# Patient Record
Sex: Male | Born: 1975
Health system: Southern US, Community
[De-identification: ages and names within clinical notes are randomized; demographics above are authoritative.]

## PROBLEM LIST (undated history)

## (undated) DIAGNOSIS — G4733 Obstructive sleep apnea (adult) (pediatric): Secondary | ICD-10-CM

## (undated) DIAGNOSIS — F419 Anxiety disorder, unspecified: Secondary | ICD-10-CM

## (undated) DIAGNOSIS — E782 Mixed hyperlipidemia: Secondary | ICD-10-CM

## (undated) DIAGNOSIS — J45909 Unspecified asthma, uncomplicated: Secondary | ICD-10-CM

## (undated) HISTORY — DX: Obstructive sleep apnea (adult) (pediatric): G47.33

## (undated) HISTORY — DX: Mixed hyperlipidemia: E78.2

## (undated) HISTORY — DX: Unspecified asthma, uncomplicated: J45.909

## (undated) HISTORY — DX: Anxiety disorder, unspecified: F41.9

---
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
## 2007-07-18 HISTORY — PX: CLOSED REDUCTION CLAVICLE FRACTURE: SUR253

## 2015-06-17 ENCOUNTER — Emergency Department (INDEPENDENT_AMBULATORY_CARE_PROVIDER_SITE_OTHER): Payer: 59

## 2015-06-17 ENCOUNTER — Encounter (HOSPITAL_COMMUNITY): Payer: Self-pay | Admitting: *Deleted

## 2015-06-17 ENCOUNTER — Emergency Department (INDEPENDENT_AMBULATORY_CARE_PROVIDER_SITE_OTHER)
Admission: EM | Admit: 2015-06-17 | Discharge: 2015-06-17 | Disposition: A | Discharge status: Home / Self Care | Payer: 59 | Source: Home / Self Care | Attending: Family Medicine | Admitting: Family Medicine

## 2015-06-17 DIAGNOSIS — S6000XA Contusion of unspecified finger without damage to nail, initial encounter: Secondary | ICD-10-CM

## 2015-06-17 MED ORDER — HYDROCODONE-ACETAMINOPHEN 5-325 MG PO TABS: 1.0000 | ORAL_TABLET | Freq: Four times a day (QID) | ORAL | Status: DC | PRN

## 2015-06-17 NOTE — ED Provider Notes (Signed)
CSN: [id number]     Arrival date & time 06/17/15  1901 History   First MD Initiated Contact with Patient 06/17/15 1926     Chief Complaint  Patient presents with  . Finger Injury   (Consider location/radiation/quality/duration/timing/severity/associated sxs/prior Treatment) Patient is a 39 y.o. male presenting with hand pain. The history is provided by the patient.  Hand Pain This is a new problem. The current episode started 3 to 5 hours ago (sofa dropped on finger at home on back of truck.). The problem has not changed since onset.The symptoms are aggravated by bending.    History reviewed. No pertinent past medical history. History reviewed. No pertinent past surgical history. History reviewed. No pertinent family history. Social History  Substance Use Topics  . Smoking status: None  . Smokeless tobacco: None  . Alcohol Use: No    Review of Systems  Constitutional: Negative.   Musculoskeletal: Negative for joint swelling.  Skin: Positive for wound.  All other systems reviewed and are negative.   Allergies  Review of patient's allergies indicates no known allergies.  Home Medications   Prior to Admission medications   Medication Sig Start Date End Date Taking? Authorizing Provider  HYDROcodone-acetaminophen (NORCO/VICODIN) 5-325 MG tablet Take 1 tablet by mouth every 6 (six) hours as needed. 06/17/15   Billy Fischer, MD   Meds Ordered and Administered this Visit  Medications - No data to display  BP 132/76 mmHg  Pulse 78  Temp(Src) 98.6 F (37 C) (Oral)  Resp 18  SpO2 100% No data found.   Physical Exam  Constitutional: He is oriented to person, place, and time. He appears well-developed and well-nourished.  Musculoskeletal: He exhibits tenderness.       Hands: Neurological: He is alert and oriented to person, place, and time.  Skin: Skin is warm and dry.  Nursing note and vitals reviewed.   ED Course  Procedures (including critical care time)  Labs  Review Labs Reviewed - No data to display  Imaging Review Dg Finger Index Left  06/17/2015  CLINICAL DATA:  39 year old male with trauma to the left index finger. EXAM: LEFT INDEX FINGER 2+V COMPARISON:  None. FINDINGS: There is no acute fracture or dislocation. There is diffuse soft tissue swelling of the index finger. No radiopaque foreign object identified. IMPRESSION: No fracture or acute traumatic/ osseous pathology. Electronically Signed   By: Anner Crete M.D.   On: 06/17/2015 19:45   X-rays reviewed and report per radiologist.   Visual Acuity Review  Right Eye Distance:   Left Eye Distance:   Bilateral Distance:    Right Eye Near:   Left Eye Near:    Bilateral Near:         MDM   1. Finger contusion, initial encounter        Billy Fischer, MD 06/17/15 2027

## 2015-06-17 NOTE — ED Notes (Signed)
Pt   Reports    Pt       He   Crushed  His  l    Index       Finger    Between       Two  Metal  Objects  Today         The      Finger       Has   A  Break in  Skin           the  Finger  Is   Soaking      In     hibiclens       Solution

## 2015-06-17 NOTE — Discharge Instructions (Signed)
Ice and pain medicine as needed.return if any problems.

## 2015-09-02 DIAGNOSIS — M5116 Intervertebral disc disorders with radiculopathy, lumbar region: Secondary | ICD-10-CM | POA: Diagnosis not present

## 2015-11-18 ENCOUNTER — Other Ambulatory Visit (HOSPITAL_COMMUNITY): Payer: Self-pay | Admitting: Physical Medicine and Rehabilitation

## 2015-11-18 DIAGNOSIS — M48061 Spinal stenosis, lumbar region without neurogenic claudication: Secondary | ICD-10-CM

## 2015-11-19 ENCOUNTER — Other Ambulatory Visit: Payer: Self-pay | Admitting: Physical Medicine and Rehabilitation

## 2015-11-19 DIAGNOSIS — M48061 Spinal stenosis, lumbar region without neurogenic claudication: Secondary | ICD-10-CM

## 2015-11-25 ENCOUNTER — Ambulatory Visit
Admission: RE | Admit: 2015-11-25 | Discharge: 2015-11-25 | Disposition: A | Discharge status: Home / Self Care | Payer: 59 | Source: Ambulatory Visit | Attending: Physical Medicine and Rehabilitation | Admitting: Physical Medicine and Rehabilitation

## 2015-11-25 DIAGNOSIS — M48061 Spinal stenosis, lumbar region without neurogenic claudication: Secondary | ICD-10-CM

## 2015-11-25 DIAGNOSIS — M5126 Other intervertebral disc displacement, lumbar region: Secondary | ICD-10-CM | POA: Diagnosis not present

## 2016-02-22 MED FILL — VENTOLIN HFA 90 MCG INHALER: 108 (90 BAS | 25 days supply | Qty: 18 | Fill #0

## 2016-04-05 DIAGNOSIS — M1611 Unilateral primary osteoarthritis, right hip: Secondary | ICD-10-CM | POA: Diagnosis not present

## 2016-04-05 MED FILL — CELECOXIB 200 MG CAPSULE: 200 | 30 days supply | Qty: 30 | Fill #0

## 2016-06-15 ENCOUNTER — Other Ambulatory Visit (INDEPENDENT_AMBULATORY_CARE_PROVIDER_SITE_OTHER): Payer: Self-pay | Admitting: Radiology

## 2016-06-15 DIAGNOSIS — M79671 Pain in right foot: Secondary | ICD-10-CM

## 2016-06-15 MED ORDER — TRAMADOL HCL 50 MG PO TABS: 50.0000 mg | ORAL_TABLET | Freq: Two times a day (BID) | ORAL | 0 refills | Status: DC

## 2016-09-28 DIAGNOSIS — G479 Sleep disorder, unspecified: Secondary | ICD-10-CM | POA: Diagnosis not present

## 2016-10-26 ENCOUNTER — Other Ambulatory Visit (INDEPENDENT_AMBULATORY_CARE_PROVIDER_SITE_OTHER): Payer: Self-pay | Admitting: Family

## 2016-10-26 MED ORDER — ALBUTEROL SULFATE HFA 108 (90 BASE) MCG/ACT IN AERS: 2.0000 | INHALATION_SPRAY | Freq: Four times a day (QID) | RESPIRATORY_TRACT | 2 refills | Status: DC | PRN

## 2016-10-26 MED FILL — VENTOLIN HFA 90 MCG INHALER: 108 (90 BAS | 25 days supply | Qty: 18 | Fill #0

## 2016-11-28 DIAGNOSIS — R0681 Apnea, not elsewhere classified: Secondary | ICD-10-CM | POA: Diagnosis not present

## 2016-12-23 DIAGNOSIS — G4733 Obstructive sleep apnea (adult) (pediatric): Secondary | ICD-10-CM | POA: Diagnosis not present

## 2016-12-28 DIAGNOSIS — G4733 Obstructive sleep apnea (adult) (pediatric): Secondary | ICD-10-CM | POA: Diagnosis not present

## 2017-01-02 ENCOUNTER — Ambulatory Visit (INDEPENDENT_AMBULATORY_CARE_PROVIDER_SITE_OTHER): Payer: Self-pay

## 2017-01-02 ENCOUNTER — Ambulatory Visit (INDEPENDENT_AMBULATORY_CARE_PROVIDER_SITE_OTHER): Payer: 59 | Admitting: Specialist

## 2017-01-02 DIAGNOSIS — M25522 Pain in left elbow: Secondary | ICD-10-CM

## 2017-01-02 DIAGNOSIS — M79671 Pain in right foot: Secondary | ICD-10-CM

## 2017-01-02 DIAGNOSIS — M7712 Lateral epicondylitis, left elbow: Secondary | ICD-10-CM | POA: Diagnosis not present

## 2017-01-02 MED ORDER — TRAMADOL HCL 50 MG PO TABS: 50.0000 mg | ORAL_TABLET | Freq: Three times a day (TID) | ORAL | 0 refills | Status: DC | PRN

## 2017-01-02 NOTE — Progress Notes (Signed)
Office Visit Note   Patient: Gary Robertson           Date of Birth: Nov 30, 1975           MRN: [id number] Visit Date: 01/02/2017              Requested by: No referring provider defined for this encounter. PCP: Leighton Ruff, MD   Assessment & Plan: Visit Diagnoses:  1. Pain in left elbow   2. Lateral epicondylitis, left elbow   3. Pain in right foot     Plan: Patient's lateral elbow pain offered conservative treatment with injection. After patient consent tennis elbow Marcaine/Depo-Medrol injection was performed after prepping with Betadine. After sitting for a few minutes patient reported excellent relief with Marcaine in place. He was given stretching exercises. Follow-up as he. He continues to have ongoing symptoms may consider MRI scan.  Follow-Up Instructions: Return if symptoms worsen or fail to improve.   Orders:  Orders Placed This Encounter  Procedures  . Hand/Upper Extremity Injection/Arthrocentesis  . XR Elbow 2 Views Left   Meds ordered this encounter  Medications  . DISCONTD: traMADol (ULTRAM) 50 MG tablet    Sig: Take 1 tablet (50 mg total) by mouth every 8 (eight) hours as needed.    Dispense:  60 tablet    Refill:  0      Procedures: Hand/UE Inj Date/Time: 01/02/2017 4:53 PM Performed by: Lanae Crumbly Authorized by: Lanae Crumbly   Consent Given by:  Patient Indications:  Pain Condition: lateral epicondylitis   Site:  L elbow Prep: patient was prepped and draped in usual sterile fashion   Needle Size:  25 G Approach:  Lateral Ultrasound Guidance: No   Medications:  20 mg methylPREDNISolone acetate 40 MG/ML; 0.33 mL bupivacaine 0.25 %     Clinical Data: No additional findings.   Subjective: Chief Complaint  Patient presents with  . Left Elbow - Pain    HPI Patient in today with complaints of left lateral elbow pain. States that this is been ongoing for at least a couple months. Pain localized to the lateral condyle. Aggravated  with gripping and wrist extension. 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 Review of Systems No Current cardiac pulmonary GI GU issues  Objective: Vital Signs: There were no vitals taken for this visit.  Physical Exam  Constitutional: He is oriented to person, place, and time. No distress.  HENT:  Head: Normocephalic.  Eyes: EOM are normal. Pupils are equal, round, and reactive to light.  Pulmonary/Chest: No respiratory distress.  Musculoskeletal:  Of the elbow good range of motion. No swelling or bruising. He is March markedly tender over the lateral condyle. Minimally tender over the radial tunnel. Lateral elbow pain aggravated with resisted forearm supination, wrist extension and grip testing. Neurovascular intact. Negative Tinel's over the cubital tunnel.  Neurological: He is oriented to person, place, and time.    Ortho Exam  Specialty Comments:  No specialty comments available.  Imaging: No results found.   PMFS History: There are no active problems to display for this patient.  Past Medical History:  Diagnosis Date  . Anxiety   . Mixed hyperlipidemia   . OSA (obstructive sleep apnea)    MILD    No family history on file.  No past surgical history on file. Social History   Occupational History  . Occupation: IT  Tobacco Use  . Smoking status: Former Research scientist (life sciences)  . Smokeless tobacco:  Never Used  Substance and Sexual Activity  . Alcohol use: Yes  . Drug use: No  . Sexual activity: Not on file

## 2017-01-22 ENCOUNTER — Other Ambulatory Visit (INDEPENDENT_AMBULATORY_CARE_PROVIDER_SITE_OTHER): Payer: Self-pay | Admitting: Family

## 2017-01-22 MED ORDER — SCOPOLAMINE 1 MG/3DAYS TD PT72: 1.0000 | MEDICATED_PATCH | TRANSDERMAL | 1 refills | Status: DC

## 2017-01-22 MED FILL — TRANSDERM-SCOP 1.5 MG/72HR: 1 | 30 days supply | Qty: 10 | Fill #0

## 2017-04-17 DIAGNOSIS — I499 Cardiac arrhythmia, unspecified: Secondary | ICD-10-CM | POA: Diagnosis not present

## 2017-04-19 ENCOUNTER — Telehealth: Payer: Self-pay

## 2017-04-19 NOTE — Telephone Encounter (Signed)
Notes sent to scheduling.   

## 2017-04-20 DIAGNOSIS — R002 Palpitations: Secondary | ICD-10-CM | POA: Diagnosis not present

## 2017-04-20 DIAGNOSIS — E782 Mixed hyperlipidemia: Secondary | ICD-10-CM | POA: Diagnosis not present

## 2017-04-23 DIAGNOSIS — B078 Other viral warts: Secondary | ICD-10-CM | POA: Diagnosis not present

## 2017-04-30 ENCOUNTER — Ambulatory Visit (INDEPENDENT_AMBULATORY_CARE_PROVIDER_SITE_OTHER): Payer: 59 | Admitting: Radiology

## 2017-04-30 DIAGNOSIS — Z0000 Encounter for general adult medical examination without abnormal findings: Secondary | ICD-10-CM | POA: Diagnosis not present

## 2017-05-03 LAB — LIPID PANEL WITH LDL/HDL RATIO

## 2017-05-04 LAB — COMPREHENSIVE METABOLIC PANEL
AG Ratio: 1.8 (calc) (ref 1.0–2.5)
ALBUMIN MSPROF: 4.9 g/dL (ref 3.6–5.1)
ALKALINE PHOSPHATASE (APISO): 49 U/L (ref 40–115)
ALT: 36 U/L (ref 9–46)
AST: 34 U/L (ref 10–40)
BILIRUBIN TOTAL: 0.9 mg/dL (ref 0.2–1.2)
BUN: 12 mg/dL (ref 7–25)
CALCIUM: 9.6 mg/dL (ref 8.6–10.3)
CO2: 27 mmol/L (ref 20–32)
Chloride: 102 mmol/L (ref 98–110)
Creat: 1.05 mg/dL (ref 0.60–1.35)
Globulin: 2.8 g/dL (calc) (ref 1.9–3.7)
Glucose, Bld: 82 mg/dL (ref 65–99)
POTASSIUM: 4.5 mmol/L (ref 3.5–5.3)
SODIUM: 139 mmol/L (ref 135–146)
TOTAL PROTEIN: 7.7 g/dL (ref 6.1–8.1)

## 2017-05-04 LAB — HEMOGLOBIN A1C
Hgb A1c MFr Bld: 5.4 % of total Hgb (ref <5.7)
Mean Plasma Glucose: 108 (calc)
eAG (mmol/L): 6 (calc)

## 2017-05-04 LAB — TEST AUTHORIZATION

## 2017-05-04 LAB — LIPID PANEL
Cholesterol: 262 mg/dL — ABNORMAL HIGH (ref <200)
HDL: 59 mg/dL (ref >40)
LDL Cholesterol (Calc): 177 mg/dL (calc) — ABNORMAL HIGH
NON-HDL CHOLESTEROL (CALC): 203 mg/dL — AB (ref <130)
Total CHOL/HDL Ratio: 4.4 (calc) (ref <5.0)
Triglycerides: 124 mg/dL (ref <150)

## 2017-05-10 ENCOUNTER — Encounter: Payer: Self-pay | Admitting: Cardiology

## 2017-05-30 ENCOUNTER — Ambulatory Visit: Payer: 59 | Admitting: Cardiology

## 2017-06-01 ENCOUNTER — Encounter: Payer: Self-pay | Admitting: Cardiology

## 2017-06-13 ENCOUNTER — Encounter: Payer: Self-pay | Admitting: Cardiology

## 2017-06-13 ENCOUNTER — Encounter (INDEPENDENT_AMBULATORY_CARE_PROVIDER_SITE_OTHER): Payer: Self-pay

## 2017-06-13 ENCOUNTER — Ambulatory Visit: Payer: 59 | Admitting: Cardiology

## 2017-06-13 VITALS — BP 126/78 | HR 55 | Ht 73.0 in | Wt 200.4 lb

## 2017-06-13 DIAGNOSIS — R9431 Abnormal electrocardiogram [ECG] [EKG]: Secondary | ICD-10-CM | POA: Diagnosis not present

## 2017-06-13 DIAGNOSIS — E7800 Pure hypercholesterolemia, unspecified: Secondary | ICD-10-CM | POA: Diagnosis not present

## 2017-06-13 DIAGNOSIS — R002 Palpitations: Secondary | ICD-10-CM | POA: Diagnosis not present

## 2017-06-13 NOTE — Patient Instructions (Signed)
Medication Instructions:  The current medical regimen is effective;  continue present plan and medications.  Testing/Procedures: Your physician has requested that you have an echocardiogram. Echocardiography is a painless test that uses sound waves to create images of your heart. It provides your doctor with information about the size and shape of your heart and how well your heart's chambers and valves are working. This procedure takes approximately one hour. There are no restrictions for this procedure.  Follow-Up: Follow up as needed after the above testing.  Thank you for choosing [facility]!!     

## 2017-06-13 NOTE — Progress Notes (Signed)
Cardiology Office Note:    Date:  06/13/2017   ID:  Dorcas Mcmurray, DOB Aug 02, 1975, MRN [id number]  PCP:  Leighton Ruff, MD  Cardiologist:  Candee Furbish, MD   Referring MD: Leighton Ruff, MD     History of Present Illness:    EMAAD NANNA is a 41 y.o. male with a hx of anxiety, hyperlipidemia, OSA here for the evaluation of palpitations at the request of Dr. Drema Dallas.  Has been experiencing occasional increased heart rate measured at 120 bpm feeling/but no chest pain, syncope, orthopnea, PND.  Blood pressure is usually normal.  Pulse is usually around 67.  Increased stress at work, a few glasses of wine or beer at night but not excessive.  Occasional smoking of cigarettes.  Exertional activity outside of work such as yard work has been done successfully without any symptoms.  Back in 2014 he saw Dr. Radford Pax with similar type of symptoms and had a treadmill test and echocardiogram which were normal.  At home BP at home 140/100 intermittently. HR Apple watch at times can be 120 or so after doing activities at home but gradually reduces with rest. 3 times at chiropractor noted increased blood pressure, he was told at one point that he may need medication if it does not lower.. Quit e cig. Rare sharp CP, likely musculoskeletal.   He does run approximately 3 days a week without any difficulty.  Past Medical History:  Diagnosis Date  . Anxiety   . Mixed hyperlipidemia   . OSA (obstructive sleep apnea)    MILD    History reviewed. No pertinent surgical history.  Current Medications: Current Meds  Medication Sig  . Multiple Vitamin (MULTIVITAMIN) tablet Take 1 tablet by mouth daily.  . Omega-3 Fatty Acids (FISH OIL) 1000 MG CAPS Take 1 capsule by mouth daily.     Allergies:   Zoloft [sertraline hcl]   Social History   Socioeconomic History  . Marital status: Married    Spouse name: None  . Number of children: None  . Years of education: None  . Highest education level:  None  Social Needs  . Financial resource strain: None  . Food insecurity - worry: None  . Food insecurity - inability: None  . Transportation needs - medical: None  . Transportation needs - non-medical: None  Occupational History  . Occupation: IT  Tobacco Use  . Smoking status: Former Research scientist (life sciences)  . Smokeless tobacco: Never Used  Substance and Sexual Activity  . Alcohol use: Yes  . Drug use: No  . Sexual activity: None  Other Topics Concern  . None  Social History Narrative  . None     Family History: The patient's M and F on HBP treatment.  No MI  ROS:   Please see the history of present illness.     All other systems reviewed and are negative.  EKGs/Labs/Other Studies Reviewed:    The following studies were reviewed today: Prior office notes, lab work, EKG reviewed  EKG:  EKG is ordered today.  The ekg ordered today demonstrates 06/13/17 sinus bradycardia rate 55 with left ventricular hypertrophy personally viewed  Recent Labs: 04/30/2017: ALT 36; BUN 12; Creat 1.05; Potassium 4.5; Sodium 139  Recent Lipid Panel    Component Value Date/Time   CHOL CANCELED 04/30/2017 1130   CHOL 262 (H) 04/30/2017 1130   TRIG CANCELED 04/30/2017 1130   TRIG 124 04/30/2017 1130   HDL CANCELED 04/30/2017 1130   HDL 59 04/30/2017 1130  CHOLHDL 4.4 04/30/2017 1130   LDL 177  Physical Exam:    VS:  BP 126/78   Pulse (!) 55   Ht 6\' 1"  (1.854 m)   Wt 200 lb 6.4 oz (90.9 kg)   SpO2 98%   BMI 26.44 kg/m     Wt Readings from Last 3 Encounters:  06/13/17 200 lb 6.4 oz (90.9 kg)     GEN:  Well nourished, well developed in no acute distress HEENT: Normal NECK: No JVD; No carotid bruits LYMPHATICS: No lymphadenopathy CARDIAC: RRR, no murmurs, rubs, gallops RESPIRATORY:  Clear to auscultation without rales, wheezing or rhonchi  ABDOMEN: Soft, non-tender, non-distended MUSCULOSKELETAL:  No edema; No deformity  SKIN: Warm and dry NEUROLOGIC:  Alert and oriented x  3 PSYCHIATRIC:  Normal affect   ASSESSMENT:    1. Palpitations   2. Abnormal ECG   3. Pure hypercholesterolemia    PLAN:    In order of problems listed above:  Palpitations -Occasional increased heart rate after doing some activity at home which does resolve the resting for a few minutes.  These tachycardic episodes do not appear to be abrupt onset for.  They do not sound like PSVT.  We will check an echocardiogram to ensure proper structure and function.  Abnormal EKG/hypertrophy -We will check an echocardiogram to measure the left ventricle.  Hyperlipidemia - LDL at last check was 177.  He is working on dietary modifications.  Continued exercise.  Continue to monitor this.  Tobacco use -He has stopped E cigarettes.  Continue to encourage cessation.   Medication Adjustments/Labs and Tests Ordered: Current medicines are reviewed at length with the patient today.  Concerns regarding medicines are outlined above.  Orders Placed This Encounter  Procedures  . ECHOCARDIOGRAM COMPLETE   No orders of the defined types were placed in this encounter.   Signed, Candee Furbish, MD  06/13/2017 9:41 AM    [facility]

## 2017-06-20 ENCOUNTER — Ambulatory Visit (HOSPITAL_COMMUNITY): Payer: 59 | Attending: Cardiology

## 2017-06-20 ENCOUNTER — Other Ambulatory Visit: Payer: Self-pay | Admitting: Cardiovascular Disease

## 2017-06-20 ENCOUNTER — Other Ambulatory Visit: Payer: Self-pay

## 2017-06-20 DIAGNOSIS — R002 Palpitations: Secondary | ICD-10-CM | POA: Diagnosis not present

## 2017-06-20 DIAGNOSIS — E785 Hyperlipidemia, unspecified: Secondary | ICD-10-CM | POA: Diagnosis not present

## 2017-06-20 DIAGNOSIS — G4733 Obstructive sleep apnea (adult) (pediatric): Secondary | ICD-10-CM | POA: Insufficient documentation

## 2017-06-20 DIAGNOSIS — F419 Anxiety disorder, unspecified: Secondary | ICD-10-CM | POA: Insufficient documentation

## 2017-06-21 NOTE — Addendum Note (Signed)
Addended by: Stephannie Peters on: 06/21/2017 09:32 AM   Modules accepted: Orders

## 2017-07-13 MED ORDER — BUPIVACAINE HCL 0.25 % IJ SOLN
0.3300 mL | INTRAMUSCULAR | Status: AC | PRN
  Administered 2017-01-02: .33 mL

## 2017-07-13 MED ORDER — METHYLPREDNISOLONE ACETATE 40 MG/ML IJ SUSP
20.0000 mg | INTRAMUSCULAR | Status: AC | PRN
  Administered 2017-01-02: 20 mg

## 2017-07-20 ENCOUNTER — Telehealth: Payer: 59 | Admitting: Family

## 2017-07-20 DIAGNOSIS — M549 Dorsalgia, unspecified: Principal | ICD-10-CM

## 2017-07-20 DIAGNOSIS — G8929 Other chronic pain: Secondary | ICD-10-CM

## 2017-07-20 NOTE — Progress Notes (Signed)
Thank you for the details you included in the comment boxes. Those details are very helpful in determining the best course of treatment for you and help [place] to provide the best care. Given your history, this requires a physical exam. Also, I am aware that tramadol works well for you, but that is not a medication than can be given with the E-visit program. If seen face-to-face, someone might be able to give you that medication (it requires an ink signature due to [facility] regulations), but you would still need to be examined for proper treatment.  Based on what you shared with me it looks like you have a serious condition that should be evaluated in a face to face office visit.  NOTE: Even if you have entered your credit card information for this eVisit, you will not be charged.   If you are having a true medical emergency please call 911.  If you need an urgent face to face visit, [facility] has four urgent care centers for your convenience.  If you need care fast and have a high deductible or no insurance consider:   [email or web address]  [phone]  [address], [address] [place], [place] [address] 8 am to 8 pm Monday-Friday 10 am to 4 pm Saturday-Sunday   The following sites will take your  insurance:    . [facility] [place] a Provider at this Location  [address] [place], [place] [address] . 10 am to 8 pm Monday-Friday . 12 pm to 8 pm Saturday-Sunday   . [facility] at [place] a Provider at this Location  [address], [place], [place] [address] . 8 am to 8 pm Monday-Friday . 9 am to 6 pm Saturday . 11 am to 6 pm Sunday   . [facility] at [place] Get Driving Directions  [address] Suite [place], [place] [address] . 8 am to 8 pm Monday-Friday . 8 am to 4 pm  Saturday-Sunday   Your e-visit answers were reviewed by a board certified advanced clinical practitioner to complete your personal care plan.  Thank you for using e-Visits.

## 2017-08-10 ENCOUNTER — Other Ambulatory Visit (INDEPENDENT_AMBULATORY_CARE_PROVIDER_SITE_OTHER): Payer: Self-pay | Admitting: Radiology

## 2017-08-10 ENCOUNTER — Ambulatory Visit (INDEPENDENT_AMBULATORY_CARE_PROVIDER_SITE_OTHER): Payer: 59 | Admitting: Orthopedic Surgery

## 2017-08-10 ENCOUNTER — Ambulatory Visit (INDEPENDENT_AMBULATORY_CARE_PROVIDER_SITE_OTHER): Payer: 59

## 2017-08-10 ENCOUNTER — Encounter (INDEPENDENT_AMBULATORY_CARE_PROVIDER_SITE_OTHER): Payer: Self-pay | Admitting: Orthopedic Surgery

## 2017-08-10 DIAGNOSIS — M25561 Pain in right knee: Secondary | ICD-10-CM

## 2017-08-10 MED ORDER — TRAMADOL HCL 50 MG PO TABS: 50.0000 mg | ORAL_TABLET | Freq: Three times a day (TID) | ORAL | 0 refills | Status: DC

## 2017-08-10 NOTE — Progress Notes (Signed)
Office Visit Note   Patient: Gary Robertson           Date of Birth: Oct 25, 1975           MRN: [id number] Visit Date: 08/10/2017 Requested by: Leighton Ruff, MD [place], [place] [address] PCP: Leighton Ruff, MD  Subjective: Chief Complaint  Patient presents with  . Right Knee - Injury    HPI: Gary Robertson is a patient who injured his right knee last night while snowboarding.  He was going about 30 miles an hour when he sustained a twisting injury to the knee.  His foot remained locked into the snowboard.  Reported immediate and severe onset of medial knee pain.  Denies any previous injury to the knee.  He is able to weight-bear but it is difficult.  Easier when his leg is in extension for him to weight-bear.  Knee range of motion is difficult.  Tried ibuprofen without relief.  He has more snowboarding planned for this winter.              ROS: All systems reviewed are negative as they relate to the chief complaint within the history of present illness.  Patient denies  fevers or chills.   Assessment & Plan: Visit Diagnoses:  1. Acute pain of right knee     Plan: Impression is right knee pain with fairly significant medial tenderness over the medial epicondyle at the MCL attachment site.  Ultrasound examination does show some disruption of normal architecture at this level.  Not much in the way of significant effusion in the right knee.  He has a trace effusion in the left knee.  Mentation is difficult today due to guarding.  Plan is MRI scan to evaluate radial proximal attachment as well as the medial meniscus.  It is conceivable that he may have injured his anterior cruciate ligament but we will have to tell that from the MRI scan because his examination cannot be done without enough guarding to render it nondiagnostic.  We will also try pen said and an Ace wrap.  He is not too keen on the idea of a hinged knee brace.  Follow-Up Instructions: Return for after MRI.    Orders:  Orders Placed This Encounter  Procedures  . XR KNEE 3 VIEW RIGHT  . MR Knee Right w/o contrast   No orders of the defined types were placed in this encounter.     Procedures: No procedures performed   Clinical Data: No additional findings.  Objective: Vital Signs: There were no vitals taken for this visit.  Physical Exam:   Constitutional: Patient appears well-developed HEENT:  Head: Normocephalic Eyes:EOM are normal Neck: Normal range of motion Cardiovascular: Normal rate Pulmonary/chest: Effort normal Neurologic: Patient is alert Skin: Skin is warm Psychiatric: Patient has normal mood and affect    Ortho Exam: Orthopedic exam demonstrates antalgic gait to the right.  He has trace effusion in both knees.  Extensor mechanism is intact on the right.  He does have pain with valgus stress.  No pain with varus stress.  There is no real ecchymosis or significant swelling around the knee region particularly on the medial side.  Distal MCL is nontender but proximal MCL is tender.  He also has some posterior medial joint line tenderness.  Not much in the way of definitive rotational instability.  Specialty Comments:  No specialty comments available.  Imaging: Xr Knee 3 View Right  Result Date: 08/10/2017 AP lateral merchant right  knee reviewed.  Slight varus alignment is present.  No definite effusion or fracture is noted.  Patella height normal in relation to the femur.  Slight patellar spurring is present.    PMFS History: There are no active problems to display for this patient.  Past Medical History:  Diagnosis Date  . Anxiety   . Mixed hyperlipidemia   . OSA (obstructive sleep apnea)    MILD    History reviewed. No pertinent family history.  History reviewed. No pertinent surgical history. Social History   Occupational History  . Occupation: IT  Tobacco Use  . Smoking status: Former Research scientist (life sciences)  . Smokeless tobacco: Never Used  Substance and  Sexual Activity  . Alcohol use: Yes  . Drug use: No  . Sexual activity: Not on file

## 2017-08-11 ENCOUNTER — Ambulatory Visit
Admission: RE | Admit: 2017-08-11 | Discharge: 2017-08-11 | Disposition: A | Discharge status: Home / Self Care | Payer: 59 | Source: Ambulatory Visit | Attending: Orthopedic Surgery | Admitting: Orthopedic Surgery

## 2017-08-11 DIAGNOSIS — M25561 Pain in right knee: Secondary | ICD-10-CM

## 2017-08-11 DIAGNOSIS — R600 Localized edema: Secondary | ICD-10-CM | POA: Diagnosis not present

## 2017-10-11 MED FILL — VENTOLIN HFA 90 MCG INHALER: 108 (90 BAS | 25 days supply | Qty: 18 | Fill #1

## 2018-05-21 ENCOUNTER — Ambulatory Visit (INDEPENDENT_AMBULATORY_CARE_PROVIDER_SITE_OTHER): Payer: 59

## 2018-05-21 ENCOUNTER — Encounter (INDEPENDENT_AMBULATORY_CARE_PROVIDER_SITE_OTHER): Payer: Self-pay | Admitting: Family Medicine

## 2018-05-21 ENCOUNTER — Ambulatory Visit (INDEPENDENT_AMBULATORY_CARE_PROVIDER_SITE_OTHER): Payer: 59 | Admitting: Family Medicine

## 2018-05-21 DIAGNOSIS — M25572 Pain in left ankle and joints of left foot: Secondary | ICD-10-CM | POA: Diagnosis not present

## 2018-05-21 NOTE — Progress Notes (Addendum)
I saw and examined the patient with Dr. Okey Dupre and agree with assessment and plan as outlined.  WBAT in ASO brace, theraband when pain permits.  X-Rays in 3-4 weeks if not improving.  Left ankle x-rays: Ankle mortise is intact.  Dorsal avulsion fracture anterior talus.  No other acute abnormality seen.  Plantar fascia traction spur present on the calcaneus.

## 2018-05-21 NOTE — Progress Notes (Signed)
  ELVIN MCCARTIN - 42 y.o. male MRN [id number]  Date of birth: Feb 08, 1976    SUBJECTIVE:      Chief Complaint:/ HPI:  42 year old male with acute left ankle pain for the past 2 days.  Patient states he was skateboarding and had an inversion injury to his ankle.  He notes significant swelling which has already begun to improve.  He also reports bruising.  He does have pain with weightbearing and ambulation.  He notes some mild decrease sensation over the lateral foot in the area of the ATFL.  Ice, NSAIDs, and elevation to help the pain.   ROS:     See HPI  PERTINENT  PMH / PSH FH / / SH:  Past Medical, Surgical, Social, and Family History Reviewed & Updated in the EMR.   OBJECTIVE: There were no vitals taken for this visit.  Physical Exam:  Vital signs are reviewed.  GEN: Alert and oriented, NAD Pulm: Breathing unlabored PSY: normal mood, congruent affect  MSK: Left ankle: - Inspection: No significant deformity.  There is somewhat circumferential swelling around the ankle.  There is also bruising over the medial lateral ankle. - Palpation: Patient has significant tenderness over the dorsal aspect of the foot overlying the talus.  There is also some tenderness over the ATFL as well as the medial malleolus. - Strength: Patient has good strength but somewhat limited due to pain. - ROM: Decreased range of motion in dorsiflexion and plantarflexion due to pain and swelling - Neuro/vasc: Slight decrease sensation to light touch over the lateral ankle - Special Tests: Negative anterior drawer    ASSESSMENT & PLAN:  1.  Left ankle pain secondary to combination of ankle sprain and avulsion fracture of the dorsal aspect of the talus. - Patient placed in ASO - Limit activity for the next few weeks. -Ice -NSAIDs for pain - f/u as needed

## 2018-06-28 ENCOUNTER — Ambulatory Visit (HOSPITAL_COMMUNITY)
Admission: RE | Admit: 2018-06-28 | Discharge: 2018-06-28 | Disposition: A | Discharge status: Home / Self Care | Payer: 59 | Source: Ambulatory Visit | Attending: Certified Nurse Midwife | Admitting: Certified Nurse Midwife

## 2018-06-28 DIAGNOSIS — Z1371 Encounter for nonprocreative screening for genetic disease carrier status: Secondary | ICD-10-CM | POA: Diagnosis not present

## 2018-06-28 DIAGNOSIS — Z3149 Encounter for other procreative investigation and testing: Secondary | ICD-10-CM | POA: Diagnosis not present

## 2018-07-19 ENCOUNTER — Other Ambulatory Visit (HOSPITAL_COMMUNITY): Payer: Self-pay

## 2018-07-23 ENCOUNTER — Other Ambulatory Visit (HOSPITAL_COMMUNITY): Payer: Self-pay

## 2018-08-14 ENCOUNTER — Ambulatory Visit (INDEPENDENT_AMBULATORY_CARE_PROVIDER_SITE_OTHER): Payer: 59

## 2018-08-14 ENCOUNTER — Ambulatory Visit (INDEPENDENT_AMBULATORY_CARE_PROVIDER_SITE_OTHER): Payer: 59 | Admitting: Orthopedic Surgery

## 2018-08-14 ENCOUNTER — Encounter (INDEPENDENT_AMBULATORY_CARE_PROVIDER_SITE_OTHER): Payer: Self-pay | Admitting: Surgery

## 2018-08-14 VITALS — Ht 73.0 in | Wt 194.0 lb

## 2018-08-14 DIAGNOSIS — G8929 Other chronic pain: Secondary | ICD-10-CM

## 2018-08-14 DIAGNOSIS — M25511 Pain in right shoulder: Secondary | ICD-10-CM

## 2018-08-14 DIAGNOSIS — S42017A Nondisplaced fracture of sternal end of right clavicle, initial encounter for closed fracture: Secondary | ICD-10-CM

## 2018-08-14 MED ORDER — TRAMADOL HCL 50 MG PO TABS: 50.0000 mg | ORAL_TABLET | Freq: Four times a day (QID) | ORAL | 0 refills | Status: DC | PRN

## 2018-08-14 MED FILL — traMADol HCL 50 MG TABS: 50 | 15 days supply | Qty: 60 | Fill #0

## 2018-08-14 NOTE — Progress Notes (Signed)
Office Visit Note   Patient: Gary Robertson           Date of Birth: 11/12/1975           MRN: [id number] Visit Date: 08/14/2018 Requested by: Leighton Ruff, MD [place], [place] [address] PCP: Leighton Ruff, MD  Subjective: Chief Complaint  Patient presents with  . Right Shoulder - Pain   I am reviewing the plan with Jeneen Rinks today who saw Fara Olden. HPI: Abem injured his right shoulder several days ago.  He is having pain in the shoulder region.             ROS: All systems reviewed are negative as they relate to the chief complaint within the history of present illness.  Patient denies  fevers or chills.   Assessment & Plan: Visit Diagnoses:  1. Chronic right shoulder pain   2. Closed nondisplaced fracture of sternal end of right clavicle, initial encounter     Plan: Closed nondisplaced fracture of right clavicle.  Sling immobilization for 2 weeks.  Okay to begin range of motion exercises after that but no lifting with that right arm for at least 4 weeks.  He should come back in about a month or so we can check x-rays to make sure that fracture is healing.  Alternatively we can check him here in the clinic on an informal basis.  Patient evaluated by Benjiman Core, PA today whose dictating a separate note which I will attest to. Follow-Up Instructions: Return if symptoms worsen or fail to improve.   Orders:  Orders Placed This Encounter  Procedures  . XR Shoulder Right  . XR Clavicle Right   Meds ordered this encounter  Medications  . traMADol (ULTRAM) 50 MG tablet    Sig: Take 1 tablet (50 mg total) by mouth every 6 (six) hours as needed.    Dispense:  60 tablet    Refill:  0      Procedures: No procedures performed   Clinical Data: No additional findings.  Objective: Vital Signs: Ht 6\' 1"  (1.854 m)   Wt 194 lb (88 kg)   BMI 25.60 kg/m   Physical Exam:   Constitutional: Patient appears well-developed HEENT:  Head: Normocephalic Eyes:EOM  are normal Neck: Normal range of motion Cardiovascular: Normal rate Pulmonary/chest: Effort normal Neurologic: Patient is alert Skin: Skin is warm Psychiatric: Patient has normal mood and affect    Ortho Exam: Ortho exam demonstrates some pain with range of motion of that right arm.  The rest of the exam I agree with as dictated by Benjiman Core, PA.  Specialty Comments:  Test  Imaging: Xr Clavicle Right  Result Date: 08/14/2018 Right clavicle radiograph reviewed.  Medial clavicle fracture present.  Nondisplaced.  No pneumothorax.  Previous coracoclavicular ligament fixation hardware noted  Xr Shoulder Right  Result Date: 08/14/2018 AP lateral outlet right shoulder reviewed.  No acute fracture or dislocation is present.  Shoulder is reduced.  Medial clavicle fracture is noted with no displacement.  Coracoclavicular fixation is observed.  No complication without hardware.    PMFS History: There are no active problems to display for this patient.  Past Medical History:  Diagnosis Date  . Anxiety   . Mixed hyperlipidemia   . OSA (obstructive sleep apnea)    MILD    History reviewed. No pertinent family history.  History reviewed. No pertinent surgical history. Social History   Occupational History  . Occupation: IT  Tobacco Use  .  Smoking status: Former Research scientist (life sciences)  . Smokeless tobacco: Never Used  Substance and Sexual Activity  . Alcohol use: Yes  . Drug use: No  . Sexual activity: Not on file

## 2018-08-15 NOTE — Progress Notes (Signed)
Office Visit Note   Patient: Gary Robertson           Date of Birth: [birth year]           MRN: [id number] Visit Date: 08/14/2018              Requested by: Leighton Ruff, MD [place], [place] [address] PCP: Leighton Ruff, MD   Assessment & Plan: Visit Diagnoses:  1. Chronic right shoulder pain   2. Closed nondisplaced fracture of sternal end of right clavicle, initial encounter     Plan: I reviewed x-rays with Dr. Alphonzo Severance.  Patient was put into a shoulder sling and instructed to wear this as much as possible over the next 3 weeks.  No aggressive activity.  No lifting, pushing, pulling.  Follow-up in 3 weeks for recheck and I may repeat x-ray at that time.  Depending on his symptoms I may start some gentle range of motion but no resistance exercises.  I did advise patient that poor compliance could lead to him having a nonunion and chronic problems in the future.  Follow-Up Instructions: Return if symptoms worsen or fail to improve.   Orders:  Orders Placed This Encounter  Procedures  . XR Shoulder Right  . XR Clavicle Right   Meds ordered this encounter  Medications  . traMADol (ULTRAM) 50 MG tablet    Sig: Take 1 tablet (50 mg total) by mouth every 6 (six) hours as needed.    Dispense:  60 tablet    Refill:  0      Procedures: No procedures performed   Clinical Data: No additional findings.   Subjective: Chief Complaint  Patient presents with  . Right Shoulder - Pain    HPI 43 year old white male comes in today with complaints of right sided medial clavicular pain.  Patient states that he was snowboarding about a week ago and he had a hard fall directly onto his right shoulder.  He is only complaining of pain around the medial clavicle/[place] joint.  States that his shoulder is okay.  He has had previous shoulder surgery by Dr. Rhona Raider several years ago for [facility] separation.  Not complaining of any feeling of shoulder instability.  No neck  injury or difficulty breathing. Review of Systems No current cardiac pulmonary GI GU issues  Objective: Vital Signs: Ht 6\' 1"  (1.854 m)   Wt 194 lb (88 kg)   BMI 25.60 kg/m   Physical Exam Constitutional:      Appearance: Normal appearance.  HENT:     Head: Normocephalic and atraumatic.  Eyes:     Extraocular Movements: Extraocular movements intact.     Pupils: Pupils are equal, round, and reactive to light.  Pulmonary:     Effort: No respiratory distress.  Musculoskeletal:     Comments: Right shoulder he actually does have good range of motion.  Negative impingement test.  He does have some swelling over the medial clavicle/[place] joint.  This area is markedly tender palpation.  Pain over this area with shoulder adduction.  Nontender over the [facility] joint, mid or distal clavicle.  Neurovascular intact.  Skin:    General: Skin is warm and dry.  Neurological:     General: No focal deficit present.     Mental Status: He is alert and oriented to person, place, and time.  Psychiatric:        Mood and Affect: Mood normal.     Ortho Exam  Specialty Comments:  Test  Imaging: No results found.   PMFS History: There are no active problems to display for this patient.  Past Medical History:  Diagnosis Date  . Anxiety   . Mixed hyperlipidemia   . OSA (obstructive sleep apnea)    MILD    History reviewed. No pertinent family history.  History reviewed. No pertinent surgical history. Social History   Occupational History  . Occupation: IT  Tobacco Use  . Smoking status: Former Research scientist (life sciences)  . Smokeless tobacco: Never Used  Substance and Sexual Activity  . Alcohol use: Yes  . Drug use: No  . Sexual activity: Not on file

## 2018-08-26 ENCOUNTER — Other Ambulatory Visit (HOSPITAL_COMMUNITY): Payer: Self-pay

## 2018-10-09 ENCOUNTER — Other Ambulatory Visit (INDEPENDENT_AMBULATORY_CARE_PROVIDER_SITE_OTHER): Payer: Self-pay | Admitting: Family

## 2018-10-09 MED ORDER — ALBUTEROL SULFATE 108 (90 BASE) MCG/ACT IN AEPB: 2.0000 | INHALATION_SPRAY | Freq: Four times a day (QID) | RESPIRATORY_TRACT | 2 refills | Status: DC | PRN

## 2019-01-30 ENCOUNTER — Other Ambulatory Visit: Payer: Self-pay

## 2019-01-30 ENCOUNTER — Encounter: Payer: Self-pay | Admitting: Family Medicine

## 2019-01-30 ENCOUNTER — Ambulatory Visit (INDEPENDENT_AMBULATORY_CARE_PROVIDER_SITE_OTHER): Payer: 59 | Admitting: Family Medicine

## 2019-01-30 VITALS — BP 131/83 | HR 65 | Temp 98.0°F | Resp 12 | Ht 73.75 in | Wt 198.6 lb

## 2019-01-30 DIAGNOSIS — Z0000 Encounter for general adult medical examination without abnormal findings: Secondary | ICD-10-CM | POA: Diagnosis not present

## 2019-01-30 NOTE — Progress Notes (Signed)
Office Visit Note   Patient: Gary Robertson           Date of Birth: Mar 12, 1976           MRN: [id number] Visit Date: 01/30/2019 Requested by: Leighton Ruff, MD [place],  [place] [address] PCP: Leighton Ruff, MD  Subjective: Chief Complaint  Patient presents with  . Annual Exam    HPI: He is here for annual wellness exam.  No current complaints.  He does have occasional wheezing for which he uses albuterol, mainly exercise related.  Couple years ago he had some palpitations which resolved and he has not had any further troubles.  He and his wife had a baby 4 weeks ago and he is very excited about that, everything is going well so far.  Immunizations are up-to-date.  He goes to the eye doctor about every 2 years.  His last dental visit was 1 year ago and he plans to go again soon.               ROS: Denies fevers or chills.  All other systems were reviewed and are negative.  Objective: Vital Signs: BP 131/83 (BP Location: Left Arm, Patient Position: Sitting, Cuff Size: Normal)   Pulse 65   Temp 98 F (36.7 C)   Resp 12   Ht 6' 1.75" (1.873 m)   Wt 198 lb 9.6 oz (90.1 kg)   BMI 25.67 kg/m   Physical Exam:  General:  Alert and oriented, in no acute distress. Pulm:  Breathing unlabored. Psy:  Normal mood, congruent affect. Skin: No rash or suspicious lesions. HEENT:  [place]/AT, PERRLA, EOM Full, no nystagmus.  Funduscopic examination within normal limits.  No conjunctival erythema.  Tympanic membranes are pearly gray with normal landmarks.  External ear canals are normal.  Nasal passages are clear.  Oropharynx is clear.  No significant lymphadenopathy.  No thyromegaly or nodules.  2+ carotid pulses without bruits. CV: Regular rate and rhythm without murmurs, rubs, or gallops.  No peripheral edema.  2+ radial and posterior tibial pulses. Lungs: Clear to auscultation throughout with no wheezing or areas of consolidation. Abd: Bowel sounds are active, no  hepatosplenomegaly or masses.  Soft and nontender.  No audible bruits.  No evidence of ascites.   Imaging: None today.  Assessment & Plan: 1.  Wellness examination, within normal limits. -Screening labs today.  Follow-up in 1 year, sooner for any problems.     Procedures: No procedures performed  No notes on file     PMFS History: There are no active problems to display for this patient.  Past Medical History:  Diagnosis Date  . Anxiety   . Mixed hyperlipidemia   . OSA (obstructive sleep apnea)    MILD    Family History  Problem Relation Age of Onset  . Cancer Mother        CLL  . Hypertension Father   . Cancer Father        Lung - asbestos  . Cancer Sister   . Breast cancer Sister   . Cancer Sister   . Breast cancer Sister   . Diabetes Neg Hx   . Heart attack Neg Hx   . Stroke Neg Hx   . Prostate cancer Neg Hx   . Colon cancer Neg Hx     History reviewed. No pertinent surgical history. Social History   Occupational History  . Occupation: IT  Tobacco Use  . Smoking status: Former  Smoker  . Smokeless tobacco: Never Used  Substance and Sexual Activity  . Alcohol use: Yes  . Drug use: No  . Sexual activity: Not on file

## 2019-01-31 ENCOUNTER — Telehealth: Payer: Self-pay | Admitting: Family Medicine

## 2019-01-31 LAB — COMPREHENSIVE METABOLIC PANEL
AG Ratio: 1.7 (calc) (ref 1.0–2.5)
ALT: 36 U/L (ref 9–46)
AST: 27 U/L (ref 10–40)
Albumin: 4.8 g/dL (ref 3.6–5.1)
Alkaline phosphatase (APISO): 52 U/L (ref 36–130)
BUN: 13 mg/dL (ref 7–25)
CO2: 28 mmol/L (ref 20–32)
Calcium: 10.5 mg/dL — ABNORMAL HIGH (ref 8.6–10.3)
Chloride: 102 mmol/L (ref 98–110)
Creat: 1.08 mg/dL (ref 0.60–1.35)
Globulin: 2.8 g/dL (calc) (ref 1.9–3.7)
Glucose, Bld: 97 mg/dL (ref 65–99)
Potassium: 4.4 mmol/L (ref 3.5–5.3)
Sodium: 138 mmol/L (ref 135–146)
Total Bilirubin: 0.8 mg/dL (ref 0.2–1.2)
Total Protein: 7.6 g/dL (ref 6.1–8.1)

## 2019-01-31 LAB — LIPID PANEL
Cholesterol: 276 mg/dL — ABNORMAL HIGH (ref <200)
HDL: 56 mg/dL (ref >=40)
LDL Cholesterol (Calc): 176 mg/dL (calc) — ABNORMAL HIGH
Non-HDL Cholesterol (Calc): 220 mg/dL (calc) — ABNORMAL HIGH (ref <130)
Total CHOL/HDL Ratio: 4.9 (calc) (ref <5.0)
Triglycerides: 237 mg/dL — ABNORMAL HIGH (ref <150)

## 2019-01-31 LAB — CBC WITH DIFFERENTIAL/PLATELET
Absolute Monocytes: 640 cells/uL (ref 200–950)
Basophils Absolute: 58 cells/uL (ref 0–200)
Basophils Relative: 0.9 %
Eosinophils Absolute: 390 cells/uL (ref 15–500)
Eosinophils Relative: 6.1 %
HCT: 47.8 % (ref 38.5–50.0)
Hemoglobin: 16.7 g/dL (ref 13.2–17.1)
Lymphs Abs: 1478 cells/uL (ref 850–3900)
MCH: 31 pg (ref 27.0–33.0)
MCHC: 34.9 g/dL (ref 32.0–36.0)
MCV: 88.7 fL (ref 80.0–100.0)
MPV: 10.7 fL (ref 7.5–12.5)
Monocytes Relative: 10 %
Neutro Abs: 3834 cells/uL (ref 1500–7800)
Neutrophils Relative %: 59.9 %
Platelets: 252 10*3/uL (ref 140–400)
RBC: 5.39 10*6/uL (ref 4.20–5.80)
RDW: 12.6 % (ref 11.0–15.0)
Total Lymphocyte: 23.1 %
WBC: 6.4 10*3/uL (ref 3.8–10.8)

## 2019-01-31 NOTE — Telephone Encounter (Signed)
Opened in error

## 2019-01-31 NOTE — Telephone Encounter (Signed)
Main concern with labs is that triglycerides are more than double the HDL.  This increases risk of becoming diabetic.

## 2020-01-14 ENCOUNTER — Encounter: Payer: Self-pay | Admitting: Family Medicine

## 2020-01-14 ENCOUNTER — Other Ambulatory Visit: Payer: Self-pay

## 2020-01-14 ENCOUNTER — Ambulatory Visit (INDEPENDENT_AMBULATORY_CARE_PROVIDER_SITE_OTHER): Payer: No Typology Code available for payment source | Admitting: Family Medicine

## 2020-01-14 VITALS — BP 110/65 | HR 54 | Ht 73.75 in | Wt 193.8 lb

## 2020-01-14 DIAGNOSIS — E785 Hyperlipidemia, unspecified: Secondary | ICD-10-CM

## 2020-01-14 DIAGNOSIS — Z0000 Encounter for general adult medical examination without abnormal findings: Secondary | ICD-10-CM

## 2020-01-14 NOTE — Progress Notes (Signed)
Office Visit Note   Patient: Gary Robertson           Date of Birth: 12-15-1975           MRN: [id number] Visit Date: 01/14/2020 Requested by: Leighton Ruff, MD [place],  [place] [address] PCP: Leighton Ruff, MD  Subjective: Chief Complaint  Patient presents with  . Annual Exam    HPI: He is here for annual wellness exam.  Feeling well, no complaints or concerns.  He has a history of mild sleep apnea diagnosed a few years ago.  As long as he maintains a healthy weight, he has no symptoms.  He has not had any issues with palpitations.  Anxiety is minimal.  Energy level is good, he is sleeping well, no gastrointestinal issues, no skin/hair/nail abnormalities.  Immunizations are up-to-date through work.  Dental visits are up-to-date.                ROS:   All other systems were reviewed and are negative.  Objective: Vital Signs: BP 110/65   Pulse (!) 54   Ht 6' 1.75" (1.873 m)   Wt 193 lb 12.8 oz (87.9 kg)   BMI 25.05 kg/m   Physical Exam:  General:  Alert and oriented, in no acute distress. Pulm:  Breathing unlabored. Psy:  Normal mood, congruent affect. Skin: No suspicious lesions. HEENT:  [place]/AT, PERRLA, EOM Full, no nystagmus.  Funduscopic examination within normal limits.  No conjunctival erythema.  Tympanic membranes are pearly gray with normal landmarks.  External ear canals are normal.  Nasal passages are clear.  Oropharynx is clear.  No significant lymphadenopathy.  No thyromegaly or nodules.  2+ carotid pulses without bruits. CV: Regular rate and rhythm without murmurs, rubs, or gallops.  No peripheral edema.  2+ radial and posterior tibial pulses. Lungs: Clear to auscultation throughout with no wheezing or areas of consolidation. Abd: Bowel sounds are active, no hepatosplenomegaly or masses.  Soft and nontender.  No audible bruits.  No evidence of ascites. Extremities: No nail deformities, 2+ upper and lower DTRs.   Imaging: No  results found.  Assessment & Plan: 1.  Wellness examination -Labs today.  Follow-up yearly.  2.  Sleep apnea, asymptomatic with healthy weight.  3.  History of hyperlipidemia -He is eating much better now and exercising regularly.  Recheck today.     Procedures: No procedures performed  No notes on file     PMFS History: There are no problems to display for this patient.  Past Medical History:  Diagnosis Date  . Anxiety   . Mixed hyperlipidemia   . OSA (obstructive sleep apnea)    MILD    Family History  Problem Relation Age of Onset  . Cancer Mother        CLL  . Lymphoma Mother   . Hypertension Father   . Cancer Father        Lung - asbestos  . Lung cancer Father   . Cancer Sister   . Breast cancer Sister   . Cancer Sister   . Breast cancer Sister   . Diabetes Neg Hx   . Heart attack Neg Hx   . Stroke Neg Hx   . Prostate cancer Neg Hx   . Colon cancer Neg Hx     History reviewed. No pertinent surgical history. Social History   Occupational History  . Occupation: IT  Tobacco Use  . Smoking status: Former Research scientist (life sciences)  . Smokeless tobacco:  Never Used  Vaping Use  . Vaping Use: Never used  Substance and Sexual Activity  . Alcohol use: Yes  . Drug use: No  . Sexual activity: Not on file

## 2020-01-15 LAB — COMPREHENSIVE METABOLIC PANEL
AG Ratio: 1.9 (calc) (ref 1.0–2.5)
ALT: 26 U/L (ref 9–46)
AST: 20 U/L (ref 10–40)
Albumin: 4.7 g/dL (ref 3.6–5.1)
Alkaline phosphatase (APISO): 45 U/L (ref 36–130)
BUN: 14 mg/dL (ref 7–25)
CO2: 28 mmol/L (ref 20–32)
Calcium: 10 mg/dL (ref 8.6–10.3)
Chloride: 102 mmol/L (ref 98–110)
Creat: 0.94 mg/dL (ref 0.60–1.35)
Globulin: 2.5 g/dL (calc) (ref 1.9–3.7)
Glucose, Bld: 105 mg/dL — ABNORMAL HIGH (ref 65–99)
Potassium: 4.1 mmol/L (ref 3.5–5.3)
Sodium: 138 mmol/L (ref 135–146)
Total Bilirubin: 0.9 mg/dL (ref 0.2–1.2)
Total Protein: 7.2 g/dL (ref 6.1–8.1)

## 2020-01-15 LAB — CBC WITH DIFFERENTIAL/PLATELET
Absolute Monocytes: 539 cells/uL (ref 200–950)
Basophils Absolute: 20 cells/uL (ref 0–200)
Basophils Relative: 0.4 %
Eosinophils Absolute: 137 cells/uL (ref 15–500)
Eosinophils Relative: 2.8 %
HCT: 48.7 % (ref 38.5–50.0)
Hemoglobin: 15.8 g/dL (ref 13.2–17.1)
Lymphs Abs: 1156 cells/uL (ref 850–3900)
MCH: 30.9 pg (ref 27.0–33.0)
MCHC: 32.4 g/dL (ref 32.0–36.0)
MCV: 95.1 fL (ref 80.0–100.0)
MPV: 10.1 fL (ref 7.5–12.5)
Monocytes Relative: 11 %
Neutro Abs: 3048 cells/uL (ref 1500–7800)
Neutrophils Relative %: 62.2 %
Platelets: 208 10*3/uL (ref 140–400)
RBC: 5.12 10*6/uL (ref 4.20–5.80)
RDW: 13.1 % (ref 11.0–15.0)
Total Lymphocyte: 23.6 %
WBC: 4.9 10*3/uL (ref 3.8–10.8)

## 2020-01-15 LAB — LIPID PANEL
Cholesterol: 244 mg/dL — ABNORMAL HIGH (ref <200)
HDL: 62 mg/dL (ref >=40)
LDL Cholesterol (Calc): 144 mg/dL (calc) — ABNORMAL HIGH
Non-HDL Cholesterol (Calc): 182 mg/dL (calc) — ABNORMAL HIGH (ref <130)
Total CHOL/HDL Ratio: 3.9 (calc) (ref <5.0)
Triglycerides: 234 mg/dL — ABNORMAL HIGH (ref <150)

## 2020-01-16 ENCOUNTER — Telehealth: Payer: Self-pay | Admitting: Family Medicine

## 2020-01-16 NOTE — Telephone Encounter (Signed)
Labs are notable for the following:  Lipid panel is abnormal again.  Most worrisome is triglyceride level of 234 relative to HDL of 62.  When the triglycerides are more than doubled the HDL, there is a much higher risk of becoming diabetic.  In addition, blood glucose is elevated in prediabetes range at 105.  It is extremely important to minimize intake of processed carbohydrates including breads, pastas, cereals, sugars and sweets.  Regular exercise is always beneficial.  We should recheck fasting glucose, hemoglobin A1c, and insulin level in about 6 months.  We will recheck lipid panel at that time as well.

## 2020-06-24 ENCOUNTER — Other Ambulatory Visit (HOSPITAL_COMMUNITY): Payer: Self-pay

## 2020-06-25 MED FILL — AMOXICILLIN 500 MG CAPSULE: 500 | 10 days supply | Qty: 30 | Fill #0

## 2020-07-13 MED FILL — AMOXICILLIN 500 MG CAPSULE: 500 | 10 days supply | Qty: 30 | Fill #1

## 2020-12-28 ENCOUNTER — Ambulatory Visit (INDEPENDENT_AMBULATORY_CARE_PROVIDER_SITE_OTHER): Payer: No Typology Code available for payment source

## 2020-12-28 ENCOUNTER — Other Ambulatory Visit: Payer: Self-pay

## 2020-12-28 ENCOUNTER — Ambulatory Visit: Payer: No Typology Code available for payment source | Admitting: Family Medicine

## 2020-12-28 DIAGNOSIS — E785 Hyperlipidemia, unspecified: Secondary | ICD-10-CM

## 2020-12-28 DIAGNOSIS — R7309 Other abnormal glucose: Secondary | ICD-10-CM

## 2020-12-29 ENCOUNTER — Telehealth: Payer: Self-pay | Admitting: Family Medicine

## 2020-12-29 LAB — COMPREHENSIVE METABOLIC PANEL
AG Ratio: 2.1 (calc) (ref 1.0–2.5)
ALT: 22 U/L (ref 9–46)
AST: 21 U/L (ref 10–40)
Albumin: 4.9 g/dL (ref 3.6–5.1)
Alkaline phosphatase (APISO): 42 U/L (ref 36–130)
BUN: 9 mg/dL (ref 7–25)
CO2: 26 mmol/L (ref 20–32)
Calcium: 9.9 mg/dL (ref 8.6–10.3)
Chloride: 101 mmol/L (ref 98–110)
Creat: 0.86 mg/dL (ref 0.60–1.35)
Globulin: 2.3 g/dL (calc) (ref 1.9–3.7)
Glucose, Bld: 87 mg/dL (ref 65–99)
Potassium: 4.1 mmol/L (ref 3.5–5.3)
Sodium: 138 mmol/L (ref 135–146)
Total Bilirubin: 1.1 mg/dL (ref 0.2–1.2)
Total Protein: 7.2 g/dL (ref 6.1–8.1)

## 2020-12-29 LAB — HEMOGLOBIN A1C
Hgb A1c MFr Bld: 5.6 % of total Hgb (ref <5.7)
Mean Plasma Glucose: 114 mg/dL
eAG (mmol/L): 6.3 mmol/L

## 2020-12-29 LAB — CBC WITH DIFFERENTIAL/PLATELET
Absolute Monocytes: 593 cells/uL (ref 200–950)
Basophils Absolute: 40 cells/uL (ref 0–200)
Basophils Relative: 0.7 %
Eosinophils Absolute: 108 cells/uL (ref 15–500)
Eosinophils Relative: 1.9 %
HCT: 45 % (ref 38.5–50.0)
Hemoglobin: 15.5 g/dL (ref 13.2–17.1)
Lymphs Abs: 1664 cells/uL (ref 850–3900)
MCH: 30.3 pg (ref 27.0–33.0)
MCHC: 34.4 g/dL (ref 32.0–36.0)
MCV: 87.9 fL (ref 80.0–100.0)
MPV: 10.4 fL (ref 7.5–12.5)
Monocytes Relative: 10.4 %
Neutro Abs: 3295 cells/uL (ref 1500–7800)
Neutrophils Relative %: 57.8 %
Platelets: 238 10*3/uL (ref 140–400)
RBC: 5.12 10*6/uL (ref 4.20–5.80)
RDW: 12.3 % (ref 11.0–15.0)
Total Lymphocyte: 29.2 %
WBC: 5.7 10*3/uL (ref 3.8–10.8)

## 2020-12-29 LAB — INSULIN, RANDOM: Insulin: 3.3 u[IU]/mL

## 2020-12-29 LAB — LIPID PANEL
Cholesterol: 201 mg/dL — ABNORMAL HIGH (ref <200)
HDL: 51 mg/dL (ref >=40)
LDL Cholesterol (Calc): 128 mg/dL (calc) — ABNORMAL HIGH
Non-HDL Cholesterol (Calc): 150 mg/dL (calc) — ABNORMAL HIGH (ref <130)
Total CHOL/HDL Ratio: 3.9 (calc) (ref <5.0)
Triglycerides: 117 mg/dL (ref <150)

## 2020-12-29 NOTE — Progress Notes (Signed)
Here for nurse visit for labs.  Wellness exam in near future.

## 2020-12-29 NOTE — Telephone Encounter (Signed)
Numbers look much better!

## 2021-01-14 ENCOUNTER — Other Ambulatory Visit: Payer: Self-pay

## 2021-01-14 ENCOUNTER — Encounter: Payer: Self-pay | Admitting: Family Medicine

## 2021-01-14 ENCOUNTER — Ambulatory Visit (INDEPENDENT_AMBULATORY_CARE_PROVIDER_SITE_OTHER): Payer: No Typology Code available for payment source | Admitting: Family Medicine

## 2021-01-14 VITALS — BP 130/71 | Ht 73.75 in | Wt 182.8 lb

## 2021-01-14 DIAGNOSIS — R7309 Other abnormal glucose: Secondary | ICD-10-CM | POA: Diagnosis not present

## 2021-01-14 DIAGNOSIS — Z0000 Encounter for general adult medical examination without abnormal findings: Secondary | ICD-10-CM | POA: Diagnosis not present

## 2021-01-14 DIAGNOSIS — E785 Hyperlipidemia, unspecified: Secondary | ICD-10-CM | POA: Diagnosis not present

## 2021-01-14 DIAGNOSIS — M25521 Pain in right elbow: Secondary | ICD-10-CM | POA: Diagnosis not present

## 2021-01-14 NOTE — Progress Notes (Signed)
Office Visit Note   Patient: Gary Robertson           Date of Birth: [birth year]           MRN: [id number] Visit Date: 01/14/2021 Requested by: Leighton Ruff, [place],  [place] [address] PCP: Eunice Blase, MD  Subjective: Chief Complaint  Patient presents with   Annual Exam    Requests refill on his inhaler - rarely uses, but it is running low on doses left. Should he be taking omega 3/fish oil daily? Has stopped this after changing his diet.    HPI: He is here for annual wellness exam.  He does have some right bowel pain for the past 6 months, pain on the lateral aspect.  He has been doing some home exercises which are not making it go away.  Otherwise he has made good lifestyle changes, he cut out white bread and is minimizing beer intake, exercising regularly.  His recent labs looked excellent.  Family history was updated.                ROS:   All other systems were reviewed and are negative.  Objective: Vital Signs: BP 130/71 (BP Location: Left Arm, Patient Position: Sitting, Cuff Size: Normal)   Ht 6' 1.75" (1.873 m)   Wt 182 lb 12.8 oz (82.9 kg)   BMI 23.63 kg/m   Physical Exam:  General:  Alert and oriented, in no acute distress. Pulm:  Breathing unlabored. Psy:  Normal mood, congruent affect. Skin: No suspicious lesions HEENT:  [place]/AT, PERRLA, EOM Full, no nystagmus.  Funduscopic examination within normal limits.  No conjunctival erythema.  Tympanic membranes are pearly gray with normal landmarks.  External ear canals are normal.  Nasal passages are clear.  Oropharynx is clear.  No significant lymphadenopathy.  No thyromegaly or nodules.  2+ carotid pulses without bruits. CV: Regular rate and rhythm without murmurs, rubs, or gallops.  No peripheral edema.  2+ radial and posterior tibial pulses. Lungs: Clear to auscultation throughout with no wheezing or areas of consolidation. Abd: Bowel sounds are active, no hepatosplenomegaly or masses.   Soft and nontender.  No audible bruits.  No evidence of ascites. Right arm: He has full range of motion of the elbow.  He is tender at the common extensor tendon at the lateral epicondyle.  He has pain with wrist extension and third finger extension against resistance.  No tenderness at the radial tunnel.   Imaging: No results found.  Assessment & Plan: Wellness examination -Recent labs look great.  Continue with healthy lifestyle.  Follow-up in a year.  2.  Right elbow lateral epicondylitis - Discussed options, he wants to try an injection today.     Procedures: Right elbow injection: After sterile prep with Betadine, injected 3 cc 1% lidocaine without epinephrine and 6 mg betamethasone into the common extensor tendon at the area of maximum tenderness.  Complete relief during the anesthetic phase.       PMFS History: There are no problems to display for this patient.  Past Medical History:  Diagnosis Date   Anxiety    Mixed hyperlipidemia    OSA (obstructive sleep apnea)    MILD    Family History  Problem Relation Age of Onset   Cancer Mother        CLL   Lymphoma Mother    Hypertension Father    Cancer Father        Lung - asbestos  Lung cancer Father    Cancer Sister    Breast cancer Sister    Cancer Sister    Breast cancer Sister    Diabetes Neg Hx    Heart attack Neg Hx    Stroke Neg Hx    Prostate cancer Neg Hx    Colon cancer Neg Hx     History reviewed. No pertinent surgical history. Social History   Occupational History   Occupation: IT  Tobacco Use   Smoking status: Former    Pack years: 0.00   Smokeless tobacco: Never  Vaping Use   Vaping Use: Never used  Substance and Sexual Activity   Alcohol use: Yes   Drug use: No   Sexual activity: Not on file

## 2021-01-18 ENCOUNTER — Other Ambulatory Visit (HOSPITAL_COMMUNITY): Payer: Self-pay

## 2021-01-18 MED ORDER — PROAIR RESPICLICK 108 (90 BASE) MCG/ACT IN AEPB
2.0000 | INHALATION_SPRAY | Freq: Four times a day (QID) | RESPIRATORY_TRACT | 6 refills | Status: DC | PRN
  Filled 2021-01-18: qty 1, 25d supply, fill #0

## 2021-01-18 NOTE — Addendum Note (Signed)
Addended by: Hortencia Pilar on: 01/18/2021 11:39 AM   Modules accepted: Orders

## 2021-04-20 ENCOUNTER — Ambulatory Visit (INDEPENDENT_AMBULATORY_CARE_PROVIDER_SITE_OTHER): Payer: No Typology Code available for payment source | Admitting: Surgery

## 2021-04-20 ENCOUNTER — Encounter: Payer: Self-pay | Admitting: Surgery

## 2021-04-20 ENCOUNTER — Ambulatory Visit: Payer: Self-pay

## 2021-04-20 ENCOUNTER — Other Ambulatory Visit: Payer: Self-pay

## 2021-04-20 DIAGNOSIS — M25521 Pain in right elbow: Secondary | ICD-10-CM

## 2021-04-20 DIAGNOSIS — M7711 Lateral epicondylitis, right elbow: Secondary | ICD-10-CM

## 2021-04-20 NOTE — Progress Notes (Signed)
Office Visit Note   Patient: Gary Robertson           Date of Birth: 1976-06-29           MRN: [id number] Visit Date: 04/20/2021              Requested by: Eunice Blase, MD [address] [place],  [place] [address] PCP: Eunice Blase, MD   Assessment & Plan: Visit Diagnoses:  1. Pain in right elbow   2. Right elbow pain   3. Lateral epicondylitis, right elbow     Plan: With patient's ongoing and worsening right lateral elbow pain that is failed conservative treatment with stretching exercises, oral NSAIDs, cortisone injection, activity modification I recommend getting an MRI to rule out common extensor tendon tear.  Patient follow-up me after completion of the study to discuss results and I will make appropriate referral at that time.  All questions answered.  Follow-Up Instructions: Return if symptoms worsen or fail to improve.   Orders:  Orders Placed This Encounter  Procedures   XR Elbow 2 Views Right   MR Elbow Right w/o contrast   No orders of the defined types were placed in this encounter.     Procedures: No procedures performed   Clinical Data: No additional findings.   Subjective: Chief Complaint  Patient presents with   Right Elbow - Pain    HPI 45 year old white male returns with complaints of worsening right lateral elbow pain.  I briefly discussed this with patient in the past seen a couple weeks ago in the office.  Patient was seen by Dr. Junius Roads June 2022 and performed a lateral elbow Marcaine/Depo-Medrol injection for tennis elbow.  States that he had about 90% temporary relief for about a month.  Pain aggravated with just about all activity with gripping and lifting.  Significant amount of discomfort even with picking up his 15-year-old daughter.  Feels like he has decreased grip strength.  No complaints of numbness and tingling.    Objective: Vital Signs: There were no vitals taken for this visit.  Physical Exam Very pleasant male  alert and oriented in no acute distress.  Right elbow he has good range of motion.  He does have mild swelling over the lateral condyle.  In his area he is also exquisitely tender.  Lateral pain with resisted grip testing, supination, wrist extension.  Triceps tendon nontender.  Nontender over the radial tunnel. Ortho Exam  Specialty Comments:  Test  Imaging: No results found.   PMFS History: There are no problems to display for this patient.  Past Medical History:  Diagnosis Date   Anxiety    Mixed hyperlipidemia    OSA (obstructive sleep apnea)    MILD    Family History  Problem Relation Age of Onset   Cancer Mother        CLL   Lymphoma Mother    Hypertension Father    Cancer Father        Lung - asbestos   Lung cancer Father    Cancer Sister    Breast cancer Sister    Cancer Sister    Breast cancer Sister    Diabetes Neg Hx    Heart attack Neg Hx    Stroke Neg Hx    Prostate cancer Neg Hx    Colon cancer Neg Hx     History reviewed. No pertinent surgical history. Social History   Occupational History   Occupation: IT  Tobacco  Use   Smoking status: Former   Smokeless tobacco: Never  Vaping Use   Vaping Use: Never used  Substance and Sexual Activity   Alcohol use: Yes   Drug use: No   Sexual activity: Not on file

## 2021-05-09 ENCOUNTER — Other Ambulatory Visit: Payer: Self-pay

## 2021-05-09 ENCOUNTER — Ambulatory Visit
Admission: RE | Admit: 2021-05-09 | Discharge: 2021-05-09 | Disposition: A | Discharge status: Home / Self Care | Payer: No Typology Code available for payment source | Source: Ambulatory Visit | Attending: Surgery | Admitting: Surgery

## 2021-05-09 DIAGNOSIS — M25521 Pain in right elbow: Secondary | ICD-10-CM

## 2021-05-13 ENCOUNTER — Ambulatory Visit (INDEPENDENT_AMBULATORY_CARE_PROVIDER_SITE_OTHER): Payer: No Typology Code available for payment source | Admitting: Orthopedic Surgery

## 2021-05-13 ENCOUNTER — Encounter: Payer: Self-pay | Admitting: Orthopedic Surgery

## 2021-05-13 ENCOUNTER — Other Ambulatory Visit: Payer: Self-pay

## 2021-05-13 DIAGNOSIS — M7711 Lateral epicondylitis, right elbow: Secondary | ICD-10-CM

## 2021-05-13 MED ORDER — METHYLPREDNISOLONE ACETATE 40 MG/ML IJ SUSP
40.0000 mg | INTRAMUSCULAR | Status: AC | PRN
  Administered 2021-05-13: 40 mg via INTRAMUSCULAR

## 2021-05-13 MED ORDER — LIDOCAINE HCL 1 % IJ SOLN
1.0000 mL | INTRAMUSCULAR | Status: AC | PRN
  Administered 2021-05-13: 1 mL

## 2021-05-13 NOTE — Progress Notes (Signed)
Office Visit Note   Patient: Gary Robertson           Date of Birth: [birth year]           MRN: [id number] Visit Date: 05/13/2021              Requested by: Eunice Blase, MD [address], [place],  [place] [address] PCP: Eunice Blase, MD   Assessment & Plan: Visit Diagnoses:  1. Lateral epicondylitis, right elbow     Plan: We discussed the diagnosis, prognosis, non-operative and operative treatment options for lateral epicondylitis.  After our discussion, the patient would like to proceed with a repeat CSI given that he got significant relief from his previous injection.  We had a lengthy discussion regarding surgery with open debridement of the ECRB tendon.  He is not ready for surgery at this point.   We reviewed the risks and benefits of conservative management.  The patient expressed understanding of the reasoning and strategy going forward.  All patient questions and concerns were addressed.    Follow-Up Instructions: No follow-ups on file.   Orders:  No orders of the defined types were placed in this encounter.  No orders of the defined types were placed in this encounter.     Procedures: Medium Joint Inj: R lateral epicondyle on 05/13/2021 9:22 AM Indications: pain Details: 25 G 3.5 in needle, lateral approach Medications: 1 mL lidocaine 1 %; 40 mg methylPREDNISolone acetate 40 MG/ML Outcome: tolerated well, no immediate complications Procedure, treatment alternatives, risks and benefits explained, specific risks discussed. Consent was given by the patient. Patient was prepped and draped in the usual sterile fashion.      Clinical Data: No additional findings.   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  This has been going on for at least a year.  His pain is localized to the lateral elbow  just distal to the lateral epicondyle.  The pain can be as bad as 8/10 at worst.  His pain is worse w/ certain activities such as picking up his 45 year old or activities that requires wrist extension.  He has tried NSAIDs and physical therapy.  He had a CSI in June with near complete symptom relief for at least a month.  He denies any injury or trauma to the elbow.  He has also been doing a home exercise program.    Review of Systems   Objective: Vital Signs: BP 124/82 (BP Location: Left Arm, Patient Position: Sitting, Cuff Size: Normal)   Pulse 67   Ht 6\' 1"  (1.854 m)   Wt 182 lb (82.6 kg)   SpO2 98%   BMI 24.01 kg/m   Physical Exam Constitutional:      Appearance: Normal appearance.  Cardiovascular:     Rate and Rhythm: Normal rate.     Pulses: Normal pulses.  Pulmonary:     Effort: Pulmonary effort is normal.  Skin:    General: Skin is warm and dry.     Capillary Refill: Capillary refill takes less than 2 seconds.  Neurological:     Mental Status: He is alert.    Right Elbow Exam   Tenderness  The patient is experiencing tenderness in the lateral epicondyle.   Range of Motion  The patient has normal right elbow  ROM.  Muscle Strength  The patient has normal right elbow strength.  Other  Erythema: absent Sensation: normal Pulse: present  Comments:  Pain over lateral epicondyle with resisted wrist and middle finger extension with elbow extended.  No pain distal to lateral epicondyle along radial tunnel.      Specialty Comments:  Test  Imaging: Recent MRI of the right elbow reviewed and interpreted by me.  It demonstrates an under surface tear of the common extensor origin on the lateral epicondyle consistent with lateral epicondylitis.    PMFS History: Patient Active Problem List   Diagnosis Date Noted   Lateral epicondylitis, right elbow 05/13/2021   Past Medical History:  Diagnosis Date   Anxiety    Mixed hyperlipidemia    OSA (obstructive sleep  apnea)    MILD    Family History  Problem Relation Age of Onset   Cancer Mother        CLL   Lymphoma Mother    Hypertension Father    Cancer Father        Lung - asbestos   Lung cancer Father    Cancer Sister    Breast cancer Sister    Cancer Sister    Breast cancer Sister    Diabetes Neg Hx    Heart attack Neg Hx    Stroke Neg Hx    Prostate cancer Neg Hx    Colon cancer Neg Hx     History reviewed. No pertinent surgical history. Social History   Occupational History   Occupation: IT  Tobacco Use   Smoking status: Former   Smokeless tobacco: Never  Scientific laboratory technician Use: Never used  Substance and Sexual Activity   Alcohol use: Yes   Drug use: No   Sexual activity: Not on file

## 2021-07-17 HISTORY — PX: VASECTOMY: SHX75

## 2021-10-10 ENCOUNTER — Other Ambulatory Visit (HOSPITAL_COMMUNITY): Payer: Self-pay

## 2021-10-10 MED ORDER — DIAZEPAM 10 MG PO TABS
ORAL_TABLET | ORAL | 0 refills | Status: DC
  Filled 2021-10-10: qty 2, 1d supply, fill #0

## 2021-11-30 ENCOUNTER — Other Ambulatory Visit (HOSPITAL_COMMUNITY): Payer: Self-pay

## 2021-11-30 MED ORDER — TRAMADOL HCL 50 MG PO TABS
ORAL_TABLET | ORAL | 0 refills | Status: DC
  Filled 2021-11-30: qty 4, 1d supply, fill #0

## 2021-12-13 IMAGING — MR MR ELBOW*R* W/O CM
4 of 5 series · 13 of 40 positions shown · non-contrast
Comparison: Elbow radiograph 04/20/2021

CLINICAL DATA: Elbow pain, chronic, epicondylitis suspected,
nondiagnostic xray worsening right lat elbow. failed conservative
treatment with injection and exercises.

EXAM:
MRI OF THE RIGHT ELBOW WITHOUT CONTRAST
TECHNIQUE: Multiplanar, multisequence MR imaging of the elbow was performed. No
intravenous contrast was administered.

[Series 3: T1 · axial · right · 3.0mm · 0.16mm/px · z∈[-24,+64]mm · 3 of 31 slices shown]
[im 4/31]
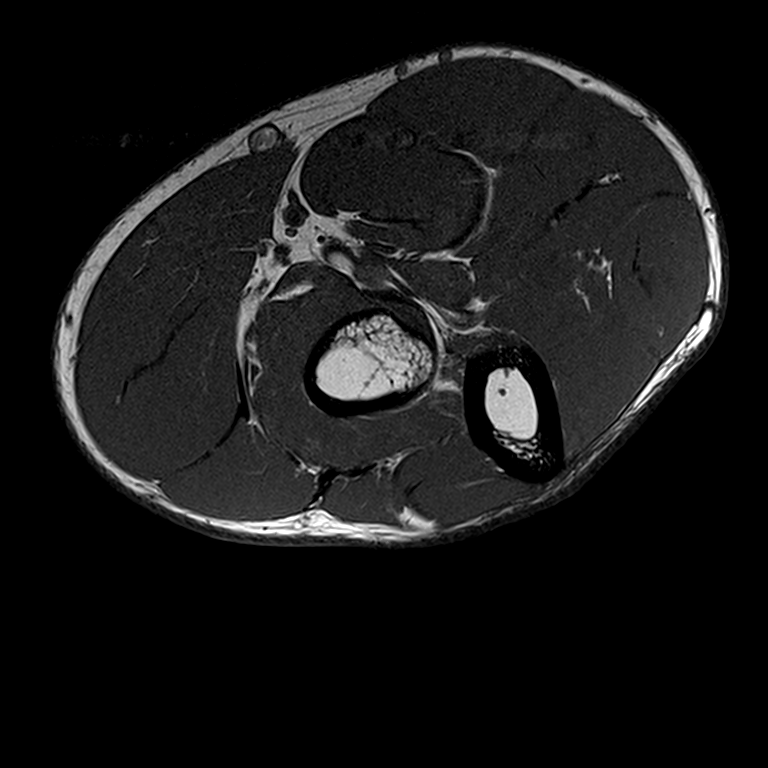
[im 16/31]
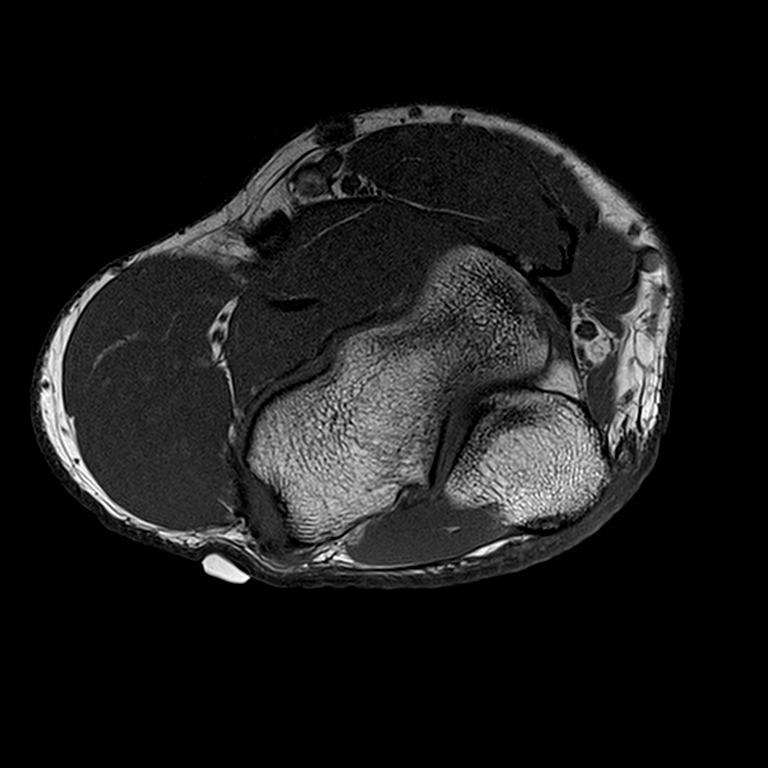
[im 27/31]
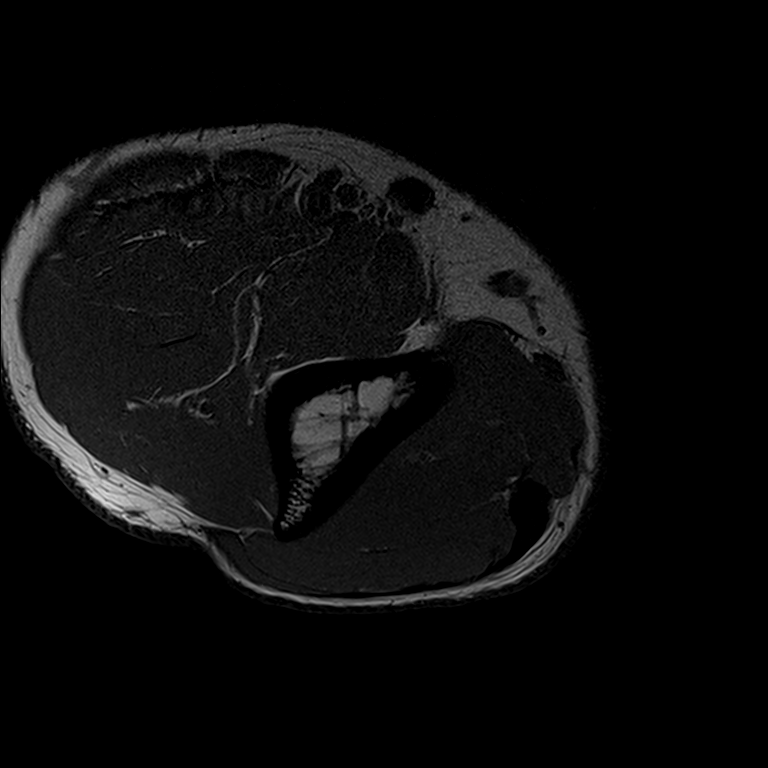

[Series 4: T2 fat-sat · axial · right · 3.0mm · 0.19mm/px · z∈[-34,+66]mm · 4 of 31 slices shown (1 of 3)]
[im 1/31]
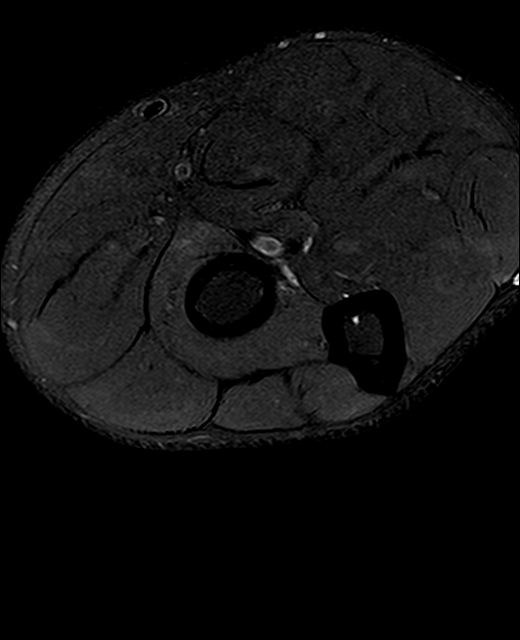
[im 4/31]
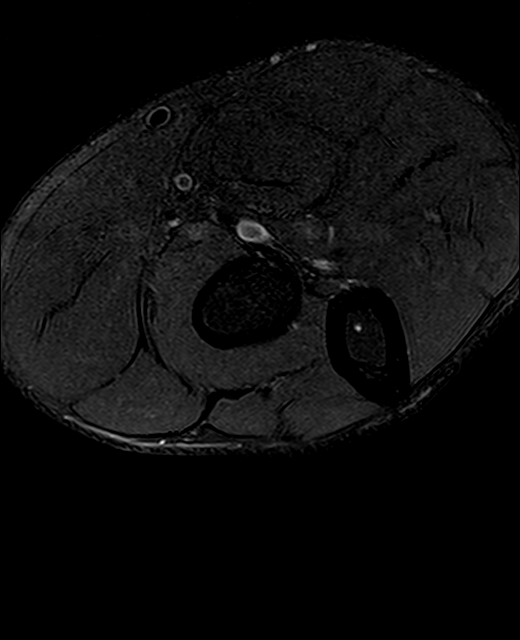
[im 17/31]
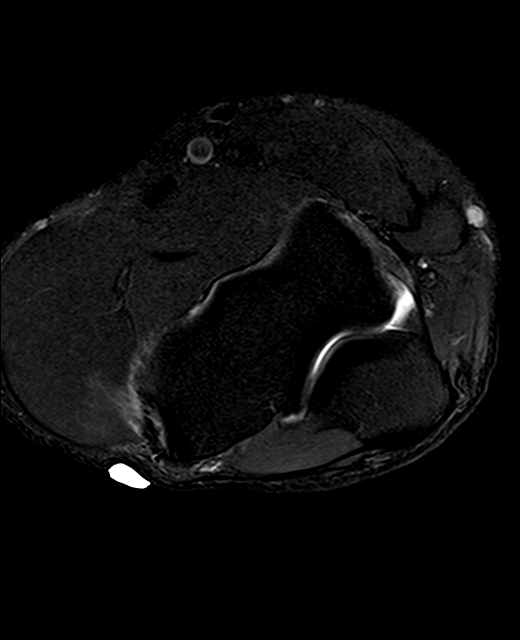
[im 27/31]
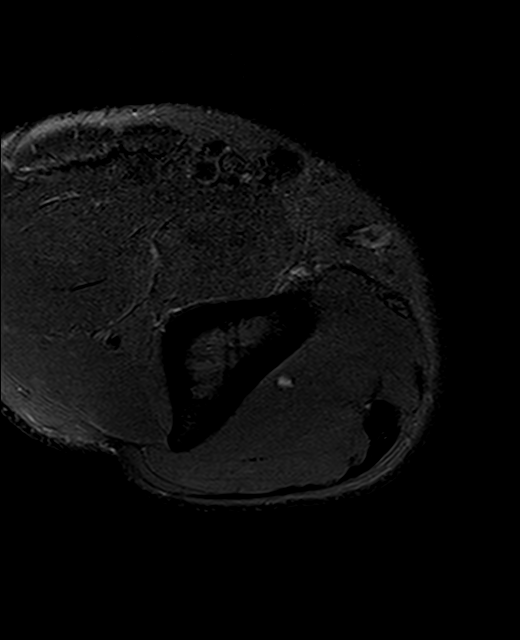

[Series 5: T2 fat-sat · oblique · right · 3.0mm · 0.22mm/px · 3 of 21 slices shown (2 of 3)]
[im 4/21]
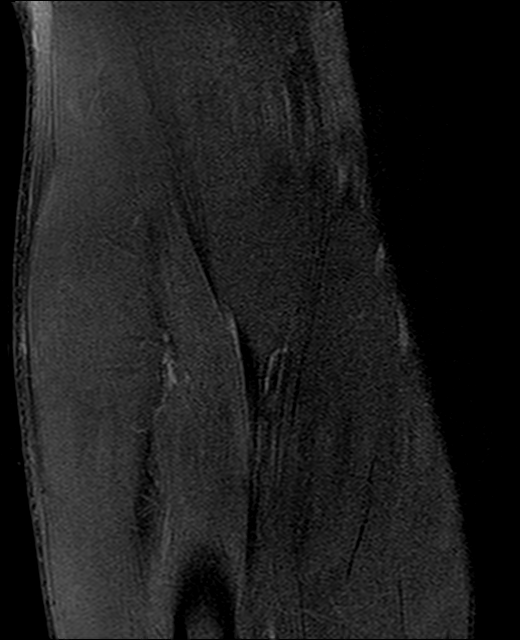
[im 11/21]
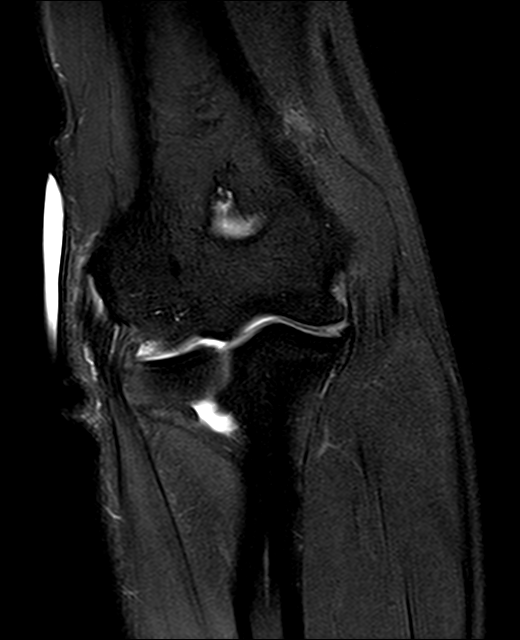
[im 17/21]
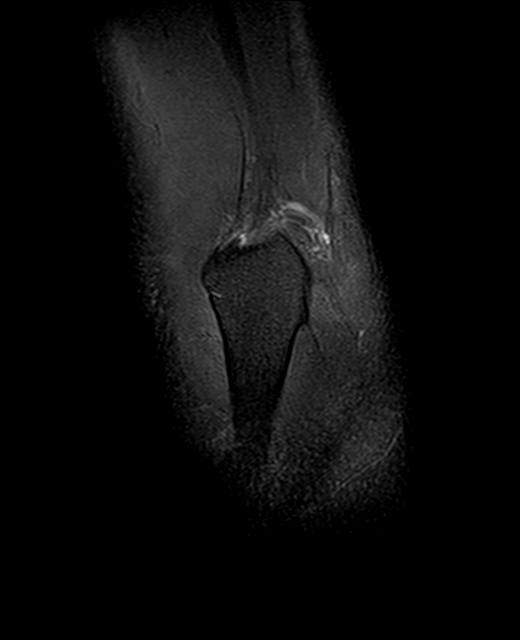

[Series 7: T2 fat-sat · oblique · right · 3.0mm · 0.22mm/px · 3 of 21 slices shown (3 of 3)]
[im 4/21]
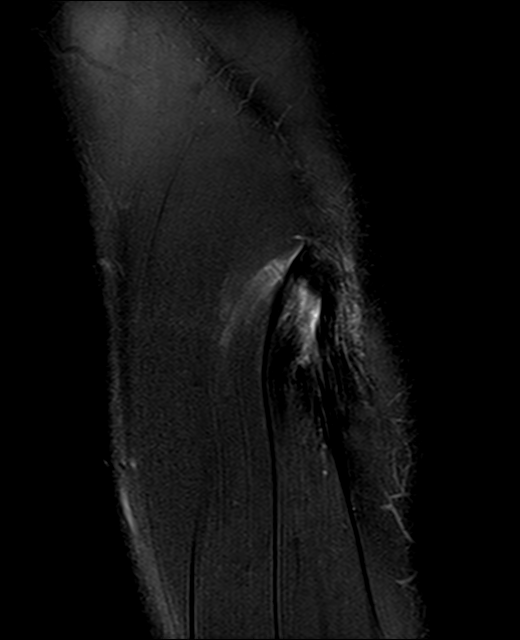
[im 11/21]
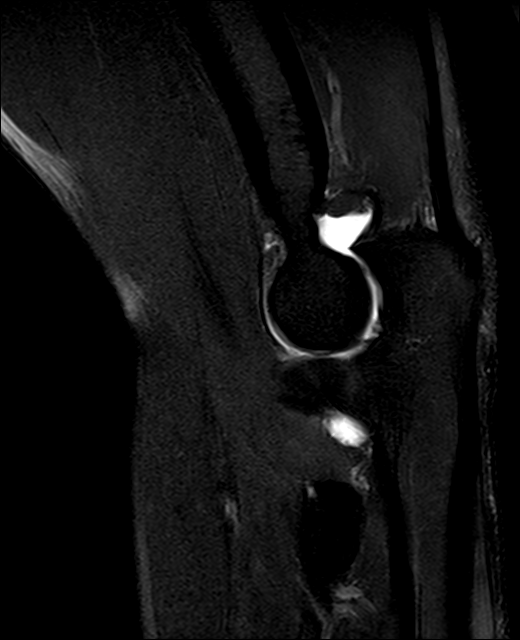
[im 17/21]
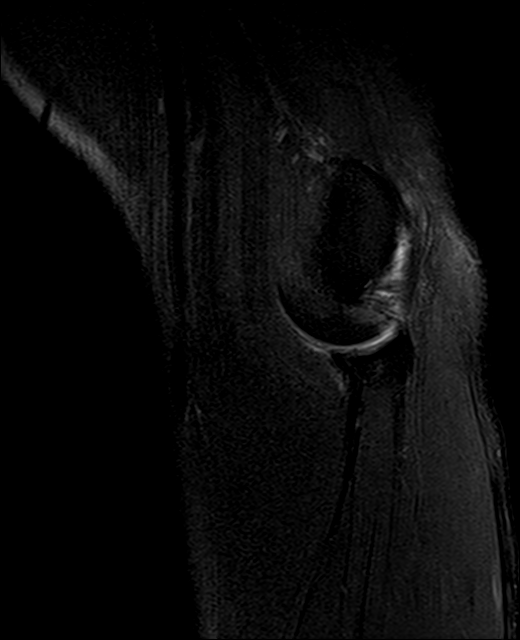

[13 of 40 positions shown; findings below may reference images not displayed]

FINDINGS: TENDONS

Common forearm flexor origin: Intact

Common forearm extensor origin: Intermediate grade, articular sided
tearing of the common extensor tendon.

Biceps: Intact

Triceps: Intact

LIGAMENTS

Medial stabilizers: Intact

Lateral stabilizers: Partial tearing of the radial collateral
ligament at its proximal attachment. Intact lateral ulnar collateral
ligament.

Cartilage: No chondral defect.

Joint: Small joint effusion.

Cubital tunnel: Normal.

Bones: No fracture or dislocation. No marrow abnormality.

Soft Tissues: No significant muscle atrophy. No intramuscular edema.
No focal fluid collection. Mild low-grade intramuscular edema at the
proximal brachioradialis likely reactive/low-grade strain.
IMPRESSION: Intermediate grade, articular sided partial tearing of the common
extensor tendon, and partial tearing of the radial collateral
ligament at its proximal attachment. Intact lateral ulnar collateral
ligament.

Small joint effusion.

## 2022-01-26 ENCOUNTER — Ambulatory Visit (HOSPITAL_BASED_OUTPATIENT_CLINIC_OR_DEPARTMENT_OTHER): Payer: No Typology Code available for payment source | Admitting: Family Medicine

## 2022-01-30 ENCOUNTER — Ambulatory Visit (INDEPENDENT_AMBULATORY_CARE_PROVIDER_SITE_OTHER): Payer: No Typology Code available for payment source | Admitting: Family Medicine

## 2022-01-30 ENCOUNTER — Encounter (HOSPITAL_BASED_OUTPATIENT_CLINIC_OR_DEPARTMENT_OTHER): Payer: Self-pay | Admitting: Family Medicine

## 2022-01-30 DIAGNOSIS — Z0000 Encounter for general adult medical examination without abnormal findings: Secondary | ICD-10-CM

## 2022-01-30 DIAGNOSIS — E785 Hyperlipidemia, unspecified: Secondary | ICD-10-CM

## 2022-01-30 NOTE — Assessment & Plan Note (Signed)
Most recent lipid panel with better control compared to prior checks Recommend continuing with lifestyle modifications, plan to recheck lipid panel with upcoming labs for CPE

## 2022-01-30 NOTE — Progress Notes (Signed)
New Patient Office Visit  Subjective    Patient ID: Gary Robertson, male    DOB: [birth year]  Age: 46 y.o. MRN: [id number]  CC:  Chief Complaint  Patient presents with   New Patient (Initial Visit)    Pt here to establish new care    HPI Gary Robertson presents to establish care Last PCP - Dr. Junius Roads  HLD: Has had elevated total cholesterol and LDL on labs in the past, most recent labs about 1 year ago did show improvement compared to priors.  He has been primarily managing with lifestyle modifications including dietary changes and regular exercise.  Father had lung cancer - passed away in 10-08-1991, did smoke, had asbestos exposure, some BP issues on father's side.  Patient is originally from [place], has been here for about 16 years. Patient works with [employer]. Outside of work, patient enjoys playing golf, snowboarding, disc golf, skate boarding.  Outpatient Encounter Medications as of 01/30/2022  Medication Sig   Albuterol Sulfate (PROAIR RESPICLICK) 759 (90 Base) MCG/ACT AEPB Inhale 2 puffs into the lungs every 6 (six) hours as needed.   Ascorbic Acid (VITAMIN C) 1000 MG tablet Take 1,000 mg by mouth daily.   [DISCONTINUED] diazepam (VALIUM) 10 MG tablet Take 1 to 2 tablets by mouth as directed. Take 1 tablet one hour prior to procedure.   Repeat in 30 min if needed.   [DISCONTINUED] Omega-3 Fatty Acids (EQL OMEGA 3 FISH OIL) 1000 MG CAPS Take by mouth daily. (Patient not taking: Reported on 01/14/2021)   [DISCONTINUED] traMADol (ULTRAM) 50 MG tablet Take 1 tablet by mouth every 6 hours as needed   No facility-administered encounter medications on file as of 01/30/2022.    Past Medical History:  Diagnosis Date   Anxiety    Mixed hyperlipidemia    OSA (obstructive sleep apnea)    MILD    History reviewed. No pertinent surgical history.  Family History  Problem Relation Age of Onset   Cancer Mother        CLL   Lymphoma Mother    Hypertension Father     Cancer Father        Lung - asbestos   Lung cancer Father    Cancer Sister    Breast cancer Sister    Cancer Sister    Breast cancer Sister    Diabetes Neg Hx    Heart attack Neg Hx    Stroke Neg Hx    Prostate cancer Neg Hx    Colon cancer Neg Hx     Social History   Socioeconomic History   Marital status: Married    Spouse name: Not on file   Number of children: Not on file   Years of education: Not on file   Highest education level: Not on file  Occupational History   Occupation: IT  Tobacco Use   Smoking status: Former   Smokeless tobacco: Never  Scientific laboratory technician Use: Never used  Substance and Sexual Activity   Alcohol use: Yes   Drug use: No   Sexual activity: Not on file  Other Topics Concern   Not on file  Social History Narrative   Not on file   Social Determinants of Health   Financial Resource Strain: Not on file  Food Insecurity: Not on file  Transportation Needs: Not on file  Physical Activity: Not on file  Stress: Not on file  Social Connections: Not on file  Intimate Partner Violence: Not on file    Objective    BP 138/86   Pulse 60   Ht '6\' 1"'$  (1.854 m)   Wt 195 lb 11.2 oz (88.8 kg)   SpO2 100%   BMI 25.82 kg/m   Physical Exam  46 year old male in no acute distress Cardiovascular exam with regular rate and rhythm, no murmur appreciated Lungs clear to auscultation bilaterally  Assessment & Plan:   Problem List Items Addressed This Visit       Other   Hyperlipidemia    Most recent lipid panel with better control compared to prior checks Recommend continuing with lifestyle modifications, plan to recheck lipid panel with upcoming labs for CPE      Other Visit Diagnoses     Wellness examination       Relevant Orders   CBC with Differential/Platelet   Comprehensive metabolic panel   Hemoglobin A1c   Lipid panel   TSH Rfx on Abnormal to Free T4       Return in about 4 weeks (around 02/27/2022) for CPE with [facility] a few  days prior.   Valdis Bevill J De [place], MD

## 2022-03-02 ENCOUNTER — Ambulatory Visit (HOSPITAL_BASED_OUTPATIENT_CLINIC_OR_DEPARTMENT_OTHER): Payer: No Typology Code available for payment source

## 2022-03-02 DIAGNOSIS — Z0000 Encounter for general adult medical examination without abnormal findings: Secondary | ICD-10-CM

## 2022-03-03 LAB — HEMOGLOBIN A1C
Est. average glucose Bld gHb Est-mCnc: 114 mg/dL
Hgb A1c MFr Bld: 5.6 % (ref 4.8–5.6)

## 2022-03-03 LAB — CBC WITH DIFFERENTIAL/PLATELET
Basophils Absolute: 0.1 10*3/uL (ref 0.0–0.2)
Basos: 1 %
EOS (ABSOLUTE): 0.1 10*3/uL (ref 0.0–0.4)
Eos: 2 %
Hematocrit: 46.3 % (ref 37.5–51.0)
Hemoglobin: 15.8 g/dL (ref 13.0–17.7)
Immature Grans (Abs): 0 10*3/uL (ref 0.0–0.1)
Immature Granulocytes: 0 %
Lymphocytes Absolute: 1.5 10*3/uL (ref 0.7–3.1)
Lymphs: 28 %
MCH: 30.3 pg (ref 26.6–33.0)
MCHC: 34.1 g/dL (ref 31.5–35.7)
MCV: 89 fL (ref 79–97)
Monocytes Absolute: 0.6 10*3/uL (ref 0.1–0.9)
Monocytes: 12 %
Neutrophils Absolute: 3 10*3/uL (ref 1.4–7.0)
Neutrophils: 57 %
Platelets: 238 10*3/uL (ref 150–450)
RBC: 5.22 x10E6/uL (ref 4.14–5.80)
RDW: 12.4 % (ref 11.6–15.4)
WBC: 5.3 10*3/uL (ref 3.4–10.8)

## 2022-03-03 LAB — COMPREHENSIVE METABOLIC PANEL WITH GFR
ALT: 27 [IU]/L (ref 0–44)
AST: 25 [IU]/L (ref 0–40)
Albumin/Globulin Ratio: 2 (ref 1.2–2.2)
Albumin: 4.9 g/dL (ref 4.1–5.1)
Alkaline Phosphatase: 51 [IU]/L (ref 44–121)
BUN/Creatinine Ratio: 11 (ref 9–20)
BUN: 11 mg/dL (ref 6–24)
Bilirubin Total: 0.7 mg/dL (ref 0.0–1.2)
CO2: 22 mmol/L (ref 20–29)
Calcium: 10.1 mg/dL (ref 8.7–10.2)
Chloride: 100 mmol/L (ref 96–106)
Creatinine, Ser: 1.01 mg/dL (ref 0.76–1.27)
Globulin, Total: 2.5 g/dL (ref 1.5–4.5)
Glucose: 94 mg/dL (ref 70–99)
Potassium: 4.4 mmol/L (ref 3.5–5.2)
Sodium: 139 mmol/L (ref 134–144)
Total Protein: 7.4 g/dL (ref 6.0–8.5)
eGFR: 93 mL/min/{1.73_m2}

## 2022-03-03 LAB — TSH RFX ON ABNORMAL TO FREE T4: TSH: 5.67 u[IU]/mL — ABNORMAL HIGH (ref 0.450–4.500)

## 2022-03-03 LAB — LIPID PANEL
Chol/HDL Ratio: 4.8 ratio (ref 0.0–5.0)
Cholesterol, Total: 259 mg/dL — ABNORMAL HIGH (ref 100–199)
HDL: 54 mg/dL
LDL Chol Calc (NIH): 166 mg/dL — ABNORMAL HIGH (ref 0–99)
Triglycerides: 212 mg/dL — ABNORMAL HIGH (ref 0–149)
VLDL Cholesterol Cal: 39 mg/dL (ref 5–40)

## 2022-03-03 LAB — T4F: T4,Free (Direct): 1.06 ng/dL (ref 0.82–1.77)

## 2022-03-09 ENCOUNTER — Ambulatory Visit (INDEPENDENT_AMBULATORY_CARE_PROVIDER_SITE_OTHER): Payer: No Typology Code available for payment source | Admitting: Family Medicine

## 2022-03-09 ENCOUNTER — Encounter (HOSPITAL_BASED_OUTPATIENT_CLINIC_OR_DEPARTMENT_OTHER): Payer: Self-pay | Admitting: Family Medicine

## 2022-03-09 VITALS — BP 126/76 | HR 66 | Temp 97.8°F | Ht 73.0 in | Wt 197.3 lb

## 2022-03-09 DIAGNOSIS — E038 Other specified hypothyroidism: Secondary | ICD-10-CM

## 2022-03-09 DIAGNOSIS — E785 Hyperlipidemia, unspecified: Secondary | ICD-10-CM

## 2022-03-09 DIAGNOSIS — Z23 Encounter for immunization: Secondary | ICD-10-CM

## 2022-03-09 DIAGNOSIS — Z0000 Encounter for general adult medical examination without abnormal findings: Secondary | ICD-10-CM

## 2022-03-09 DIAGNOSIS — Z1211 Encounter for screening for malignant neoplasm of colon: Secondary | ICD-10-CM

## 2022-03-09 NOTE — Patient Instructions (Signed)
  Medication Instructions:  Your physician recommends that you continue on your current medications as directed. Please refer to the Current Medication list given to you today. --If you need a refill on any your medications before your next appointment, please call your pharmacy first. If no refills are authorized on file call the office.-- Lab Work: Your physician has recommended that you have lab work today: No If you have labs (blood work) drawn today and your tests are completely normal, you will receive your results via [facility] a phone call from our staff.  Please ensure you check your voicemail in the event that you authorized detailed messages to be left on a delegated number. If you have any lab test that is abnormal or we need to change your treatment, we will call you to review the results.  Referrals/Procedures/Imaging: No  Follow-Up: Your next appointment:   Your physician recommends that you schedule a follow-up appointment in: 1 year cpe with Dr. de [place]. ^ months for nurse visit for labs.   You will receive a text message or e-mail with a link to a survey about your care and experience with [place] today! We would greatly appreciate your feedback!   Thanks for letting us be apart of your health journey!!  Primary Care and Sports Medicine   Dr. Arlina Robes [place]   We encourage you to activate your patient portal called "MyChart".  Sign up information is provided on this After Visit Summary.  MyChart is used to connect with patients for Virtual Visits (Telemedicine).  Patients are able to view lab/test results, encounter notes, upcoming appointments, etc.  Non-urgent messages can be sent to your provider as well. To learn more about what you can do with MyChart, please visit --  [email or web address].

## 2022-03-09 NOTE — Assessment & Plan Note (Signed)
Routine HCM labs reviewed. HCM reviewed/discussed. Anticipatory guidance regarding healthy weight, lifestyle and choices given. Recommend healthy diet.  Recommend approximately 150 minutes/week of moderate intensity exercise Recommend regular dental and vision exams Always use seatbelt/lap and shoulder restraints Recommend using smoke alarms and checking batteries at least twice a year Recommend using sunscreen when outside Discussed colon cancer screening recommendations, options.  Patient would like to proceed with colonoscopy, referral placed Discussed tetanus immunization recommendations, patient agreed to proceed with this today

## 2022-03-09 NOTE — Progress Notes (Signed)
Subjective:    CC: Annual Physical Exam  HPI:  Gary Robertson is a 46 y.o. presenting for annual physical  I reviewed the past medical history, family history, social history, surgical history, and allergies today and no changes were needed.  Please see the problem list section below in epic for further details.  Past Medical History: Past Medical History:  Diagnosis Date   Anxiety    Mixed hyperlipidemia    OSA (obstructive sleep apnea)    MILD   Past Surgical History: History reviewed. No pertinent surgical history. Social History: Social History   Socioeconomic History   Marital status: Married    Spouse name: Not on file   Number of children: Not on file   Years of education: Not on file   Highest education level: Not on file  Occupational History   Occupation: IT  Tobacco Use   Smoking status: Former   Smokeless tobacco: Never  Scientific laboratory technician Use: Never used  Substance and Sexual Activity   Alcohol use: Yes   Drug use: No   Sexual activity: Not on file  Other Topics Concern   Not on file  Social History Narrative   Not on file   Social Determinants of Health   Financial Resource Strain: Not on file  Food Insecurity: Not on file  Transportation Needs: Not on file  Physical Activity: Not on file  Stress: Not on file  Social Connections: Not on file   Family History: Family History  Problem Relation Age of Onset   Cancer Mother        CLL   Lymphoma Mother    Hypertension Father    Cancer Father        Lung - asbestos   Lung cancer Father    Cancer Sister    Breast cancer Sister    Cancer Sister    Breast cancer Sister    Diabetes Neg Hx    Heart attack Neg Hx    Stroke Neg Hx    Prostate cancer Neg Hx    Colon cancer Neg Hx    Allergies: Allergies  Allergen Reactions   Zoloft [Sertraline Hcl]     MAKE HIM MORE HYPER   Medications: See med rec.  Review of Systems: No headache, visual changes, nausea, vomiting, diarrhea,  constipation, dizziness, abdominal pain, skin rash, fevers, chills, night sweats, swollen lymph nodes, weight loss, chest pain, body aches, joint swelling, muscle aches, shortness of breath, mood changes, visual or auditory hallucinations.  Objective:    BP 126/76   Pulse 66   Temp 97.8 F (36.6 C) (Oral)   Ht '6\' 1"'$  (1.854 m)   Wt 197 lb 4.8 oz (89.5 kg)   SpO2 99%   BMI 26.03 kg/m   General: Well Developed, well nourished, and in no acute distress.  Neuro: Alert and oriented x3, extra-ocular muscles intact, sensation grossly intact. Cranial nerves II through XII are intact, motor, sensory, and coordinative functions are all intact. HEENT: Normocephalic, atraumatic, pupils equal round reactive to light, neck supple, no masses, no lymphadenopathy, thyroid nonpalpable. Oropharynx, nasopharynx, external ear canals are unremarkable. Skin: Warm and dry, no rashes noted.  Cardiac: Regular rate and rhythm, no murmurs rubs or gallops.  Respiratory: Clear to auscultation bilaterally. Not using accessory muscles, speaking in full sentences.  Abdominal: Soft, nontender, nondistended, positive bowel sounds, no masses, no organomegaly.  Musculoskeletal: Shoulder, elbow, wrist, hip, knee, ankle stable, and with full range of motion.  Impression  and Recommendations:    Wellness examination Routine HCM labs reviewed. HCM reviewed/discussed. Anticipatory guidance regarding healthy weight, lifestyle and choices given. Recommend healthy diet.  Recommend approximately 150 minutes/week of moderate intensity exercise Recommend regular dental and vision exams Always use seatbelt/lap and shoulder restraints Recommend using smoke alarms and checking batteries at least twice a year Recommend using sunscreen when outside Discussed colon cancer screening recommendations, options.  Patient would like to proceed with colonoscopy, referral placed Discussed tetanus immunization recommendations, patient agreed to  proceed with this today  Hyperlipidemia Most recent labs with elevated total cholesterol and LDL.  Current calculated ASCVD risk score is 3.1%.  Reviewed recommendations regarding lifestyle modifications, discussed that we do not need to proceed with any pharmacotherapy at this time We will continue with monitoring at future visits  Subclinical hypothyroidism Observed on most recent labs with slightly elevated TSH, normal free T4.  Discussed meaning of these findings and recommendations for repeating labs for monitoring in about 6 months. If labs remain the same, will continue with monitoring with labs in about 6 months If changes noted with elevated TSH and if thyroid hormone drops below normal range, would plan to schedule visit to discuss thyroid hormone replacement therapy If thyroid labs returned to normal, can proceed with routine monitoring  Return in about 1 year (around 03/10/2023) for CPE.  Nurse visit for labs in about 6 months to monitor thyroid function   ___________________________________________ Milika Ventress de [place], MD, ABFM, [facility] and [place]

## 2022-03-09 NOTE — Assessment & Plan Note (Signed)
Most recent labs with elevated total cholesterol and LDL.  Current calculated ASCVD risk score is 3.1%.  Reviewed recommendations regarding lifestyle modifications, discussed that we do not need to proceed with any pharmacotherapy at this time We will continue with monitoring at future visits

## 2022-03-09 NOTE — Assessment & Plan Note (Signed)
Observed on most recent labs with slightly elevated TSH, normal free T4.  Discussed meaning of these findings and recommendations for repeating labs for monitoring in about 6 months. If labs remain the same, will continue with monitoring with labs in about 6 months If changes noted with elevated TSH and if thyroid hormone drops below normal range, would plan to schedule visit to discuss thyroid hormone replacement therapy If thyroid labs returned to normal, can proceed with routine monitoring

## 2022-03-22 ENCOUNTER — Encounter: Payer: Self-pay | Admitting: Internal Medicine

## 2022-04-06 ENCOUNTER — Other Ambulatory Visit (HOSPITAL_COMMUNITY): Payer: Self-pay

## 2022-04-06 ENCOUNTER — Ambulatory Visit (AMBULATORY_SURGERY_CENTER): Payer: No Typology Code available for payment source

## 2022-04-06 VITALS — Ht 73.0 in | Wt 197.0 lb

## 2022-04-06 DIAGNOSIS — Z1211 Encounter for screening for malignant neoplasm of colon: Secondary | ICD-10-CM

## 2022-04-06 MED ORDER — NA SULFATE-K SULFATE-MG SULF 17.5-3.13-1.6 GM/177ML PO SOLN
1.0000 | Freq: Once | ORAL | 0 refills | Status: AC
  Filled 2022-04-06: qty 354, 1d supply, fill #0

## 2022-04-06 NOTE — Progress Notes (Signed)
No egg or soy allergy known to patient  No issues known to pt with past sedation with any surgeries or procedures Patient denies ever being told they had issues or difficulty with intubation  No FH of Malignant Hyperthermia Pt is not on diet pills Pt is not on home 02  Pt is not on blood thinners  Pt denies issues with constipation  No A fib or A flutter Have any cardiac testing pending--NO Pt instructed to use [email or web address] or GoodRx for a price reduction on prep  Insurance confirmed during [place] appt-[facility]

## 2022-05-04 ENCOUNTER — Encounter: Payer: Self-pay | Admitting: Internal Medicine

## 2022-05-12 ENCOUNTER — Ambulatory Visit (AMBULATORY_SURGERY_CENTER): Payer: No Typology Code available for payment source | Admitting: Internal Medicine

## 2022-05-12 ENCOUNTER — Other Ambulatory Visit: Payer: Self-pay | Admitting: Internal Medicine

## 2022-05-12 ENCOUNTER — Encounter: Payer: Self-pay | Admitting: Internal Medicine

## 2022-05-12 VITALS — BP 120/79 | HR 55 | Temp 96.2°F | Resp 11 | Ht 73.0 in | Wt 197.0 lb

## 2022-05-12 DIAGNOSIS — Z1211 Encounter for screening for malignant neoplasm of colon: Secondary | ICD-10-CM | POA: Diagnosis not present

## 2022-05-12 DIAGNOSIS — K635 Polyp of colon: Secondary | ICD-10-CM | POA: Diagnosis not present

## 2022-05-12 DIAGNOSIS — D123 Benign neoplasm of transverse colon: Secondary | ICD-10-CM

## 2022-05-12 MED ORDER — SODIUM CHLORIDE 0.9 % IV SOLN: 500.0000 mL | INTRAVENOUS | Status: DC

## 2022-05-12 NOTE — Progress Notes (Signed)
GASTROENTEROLOGY PROCEDURE H&P NOTE   Primary Care Physician: de [place], Blondell Reveal, MD    Reason for Procedure:   Colon cancer screening  Plan:    Colonoscopy  Patient is appropriate for endoscopic procedure(s) in the ambulatory ([place]) setting.  The nature of the procedure, as well as the risks, benefits, and alternatives were carefully and thoroughly reviewed with the patient. Ample time for discussion and questions allowed. The patient understood, was satisfied, and agreed to proceed.     HPI: Gary Robertson is a 46 y.o. male who presents for colonoscopy for colon cancer screening. Denies blood in stools, changes in bowel habits, weight loss. Denies family history of colon cancer.  Past Medical History:  Diagnosis Date   Anxiety    Asthma    seasonal allergy related-uses PRN inhaler   Mixed hyperlipidemia    OSA (obstructive sleep apnea)    MILD- no CPAP warranted at this time (04/06/2022)    Past Surgical History:  Procedure Laterality Date   CLOSED REDUCTION CLAVICLE FRACTURE Right 2009   VASECTOMY  2023   [place]    Prior to Admission medications   Medication Sig Start Date End Date Taking? Authorizing Provider  Ascorbic Acid (VITAMIN C) 1000 MG tablet Take 1,000 mg by mouth daily.   Yes [provider]  Albuterol Sulfate (PROAIR RESPICLICK) 631 (90 Base) MCG/ACT AEPB Inhale 2 puffs into the lungs every 6 (six) hours as needed. 01/18/21   Hilts, Michael, MD    Current Outpatient Medications  Medication Sig Dispense Refill   Ascorbic Acid (VITAMIN C) 1000 MG tablet Take 1,000 mg by mouth daily.     Albuterol Sulfate (PROAIR RESPICLICK) 497 (90 Base) MCG/ACT AEPB Inhale 2 puffs into the lungs every 6 (six) hours as needed. 1 each 6   Current Facility-Administered Medications  Medication Dose Route Frequency Provider Last Rate Last Admin   0.9 %  sodium chloride infusion  500 mL Intravenous Continuous Sharyn Creamer, MD         Allergies as of 05/12/2022 - Review Complete 05/12/2022  Allergen Reaction Noted   Zoloft [sertraline hcl]  06/01/2017    Family History  Problem Relation Age of Onset   Cancer Mother        CLL   Lymphoma Mother    Hypertension Father    Lung cancer Father        asbestos exposure   Cancer Sister    Breast cancer Sister    Cancer Sister    Breast cancer Sister    Heart attack Neg Hx    Stroke Neg Hx    Prostate cancer Neg Hx    Colon cancer Neg Hx    Colon polyps Neg Hx    Rectal cancer Neg Hx    Esophageal cancer Neg Hx     Social History   Socioeconomic History   Marital status: Married    Spouse name: Not on file   Number of children: Not on file   Years of education: Not on file   Highest education level: Not on file  Occupational History   Occupation: IT  Tobacco Use   Smoking status: Former   Smokeless tobacco: Never  Scientific laboratory technician Use: Never used  Substance and Sexual Activity   Alcohol use: Yes    Alcohol/week: 0.0 - 6.0 standard drinks of alcohol   Drug use: No   Sexual activity: Not on file  Other Topics  Concern   Not on file  Social History Narrative   Not on file   Social Determinants of Health   Financial Resource Strain: Not on file  Food Insecurity: Not on file  Transportation Needs: Not on file  Physical Activity: Not on file  Stress: Not on file  Social Connections: Not on file  Intimate Partner Violence: Not on file    Physical Exam: Vital signs in last 24 hours: BP (!) 146/84   Pulse 68   Temp (!) 96.2 F (35.7 C)   Ht '6\' 1"'$  (1.854 m)   Wt 197 lb (89.4 kg)   SpO2 98%   BMI 25.99 kg/m  GEN: NAD EYE: Sclerae anicteric ENT: MMM CV: Non-tachycardic Pulm: No increased work of breathing GI: Soft, NT/ND NEURO:  Alert & Oriented   Christia Reading, MD [facility]  05/12/2022 9:53 AM

## 2022-05-12 NOTE — Progress Notes (Signed)
Pt resting comfortably. VSS. Airway intact. SBAR complete to RN. All questions answered.   

## 2022-05-12 NOTE — Progress Notes (Signed)
Called to room to assist during endoscopic procedure.  Patient ID and intended procedure confirmed with present staff. Received instructions for my participation in the procedure from the performing physician.  

## 2022-05-12 NOTE — Op Note (Signed)
[facility] Patient Name: Gary Robertson Procedure Date: 05/12/2022 10:04 AM MRN: [id number] Endoscopist: Adline Mango Canby , , [id number] Age: 46 Referring MD:  Date of Birth: [birth year] Gender: Male Account #: [id number] Procedure:                Colonoscopy Indications:              Screening for colorectal malignant neoplasm, This                            is the patient's first colonoscopy Medicines:                Monitored Anesthesia Care Procedure:                Pre-Anesthesia Assessment:                           - Prior to the procedure, a History and Physical                            was performed, and patient medications and                            allergies were reviewed. The patient's tolerance of                            previous anesthesia was also reviewed. The risks                            and benefits of the procedure and the sedation                            options and risks were discussed with the patient.                            All questions were answered, and informed consent                            was obtained. Prior Anticoagulants: The patient has                            taken no anticoagulant or antiplatelet agents. ASA                            Grade Assessment: II - A patient with mild systemic                            disease. After reviewing the risks and benefits,                            the patient was deemed in satisfactory condition to                            undergo the procedure.  After obtaining informed consent, the colonoscope                            was passed under direct vision. Throughout the                            procedure, the patient's blood pressure, pulse, and                            oxygen saturations were monitored continuously. The                            CF [id number] #[id number] was introduced through the anus                            and advanced to  the the terminal ileum. The                            colonoscopy was performed without difficulty. The                            patient tolerated the procedure well. The quality                            of the bowel preparation was good. The terminal                            ileum, ileocecal valve, appendiceal orifice, and                            rectum were photographed. Scope In: 10:08:30 AM Scope Out: 10:27:27 AM Scope Withdrawal Time: 0 hours 15 minutes 40 seconds  Total Procedure Duration: 0 hours 18 minutes 57 seconds  Findings:                 The terminal ileum appeared normal.                           Three sessile polyps were found in the transverse                            colon. The polyps were 3 to 4 mm in size. These                            polyps were removed with a cold snare. Resection                            and retrieval were complete.                           Non-bleeding internal hemorrhoids were found during                            retroflexion. Complications:  No immediate complications. Estimated Blood Loss:     Estimated blood loss was minimal. Impression:               - The examined portion of the ileum was normal.                           - Three 3 to 4 mm polyps in the transverse colon,                            removed with a cold snare. Resected and retrieved.                           - Non-bleeding internal hemorrhoids. Recommendation:           - Discharge patient to home (with escort).                           - Await pathology results.                           - The findings and recommendations were discussed                            with the patient. Dr Georgian Co "Lyndee Leo" Lorenso Courier,  05/12/2022 10:30:57 AM

## 2022-05-12 NOTE — Progress Notes (Signed)
Pt's states no medical or surgical changes since previsit or office visit. 

## 2022-05-12 NOTE — Patient Instructions (Addendum)
-   Discharge patient to home (with escort). - Await pathology results. - The findings and recommendations were discussed with the patient.  Handouts on polyps and hemorrhoids given.   YOU HAD AN ENDOSCOPIC PROCEDURE TODAY AT [facility]:   Refer to the procedure report that was given to you for any specific questions about what was found during the examination.  If the procedure report does not answer your questions, please call your gastroenterologist to clarify.  If you requested that your care partner not be given the details of your procedure findings, then the procedure report has been included in a sealed envelope for you to review at your convenience later.  YOU SHOULD EXPECT: Some feelings of bloating in the abdomen. Passage of more gas than usual.  Walking can help get rid of the air that was put into your GI tract during the procedure and reduce the bloating. If you had a lower endoscopy (such as a colonoscopy or flexible sigmoidoscopy) you may notice spotting of blood in your stool or on the toilet paper. If you underwent a bowel prep for your procedure, you may not have a normal bowel movement for a few days.  Please Note:  You might notice some irritation and congestion in your nose or some drainage.  This is from the oxygen used during your procedure.  There is no need for concern and it should clear up in a day or so.  SYMPTOMS TO REPORT IMMEDIATELY:  Following lower endoscopy (colonoscopy or flexible sigmoidoscopy):  Excessive amounts of blood in the stool  Significant tenderness or worsening of abdominal pains  Swelling of the abdomen that is new, acute  Fever of 100F or higher  For urgent or emergent issues, a gastroenterologist can be reached at any hour by calling [phone]. Do not use MyChart messaging for urgent concerns.    DIET:  We do recommend a small meal at first, but then you may proceed to your regular diet.  Drink plenty of fluids but you  should avoid alcoholic beverages for 24 hours.  ACTIVITY:  You should plan to take it easy for the rest of today and you should NOT DRIVE or use heavy machinery until tomorrow (because of the sedation medicines used during the test).    FOLLOW UP: Our staff will call the number listed on your records the next business day following your procedure.  We will call around 7:15- 8:00 am to check on you and address any questions or concerns that you may have regarding the information given to you following your procedure. If we do not reach you, we will leave a message.     If any biopsies were taken you will be contacted by phone or by letter within the next 1-3 weeks.  Please call us at [phone] if you have not heard about the biopsies in 3 weeks.    SIGNATURES/CONFIDENTIALITY: You and/or your care partner have signed paperwork which will be entered into your electronic medical record.  These signatures attest to the fact that that the information above on your After Visit Summary has been reviewed and is understood.  Full responsibility of the confidentiality of this discharge information lies with you and/or your care-partner.

## 2022-05-15 ENCOUNTER — Telehealth: Payer: Self-pay

## 2022-05-15 NOTE — Telephone Encounter (Signed)
  Follow up Call-     05/12/2022    9:36 AM  Call back number  Post procedure Call Back phone  # [phone]  Permission to leave phone message Yes     Patient questions:  Do you have a fever, pain , or abdominal swelling? No. Pain Score  0 *  Have you tolerated food without any problems? Yes.    Have you been able to return to your normal activities? Yes.    Do you have any questions about your discharge instructions: Diet   No. Medications  No. Follow up visit  No.  Do you have questions or concerns about your Care? No.  Actions: * If pain score is 4 or above: No action needed, pain <4.

## 2022-05-16 ENCOUNTER — Encounter: Payer: Self-pay | Admitting: Internal Medicine

## 2022-07-06 ENCOUNTER — Other Ambulatory Visit: Payer: Self-pay

## 2022-07-12 ENCOUNTER — Encounter (HOSPITAL_BASED_OUTPATIENT_CLINIC_OR_DEPARTMENT_OTHER): Payer: Self-pay | Admitting: Family Medicine

## 2022-07-13 ENCOUNTER — Other Ambulatory Visit (HOSPITAL_BASED_OUTPATIENT_CLINIC_OR_DEPARTMENT_OTHER): Payer: Self-pay

## 2022-07-13 ENCOUNTER — Other Ambulatory Visit (HOSPITAL_COMMUNITY): Payer: Self-pay

## 2022-07-13 DIAGNOSIS — J9801 Acute bronchospasm: Secondary | ICD-10-CM

## 2022-07-13 MED ORDER — PROAIR RESPICLICK 108 (90 BASE) MCG/ACT IN AEPB
2.0000 | INHALATION_SPRAY | Freq: Four times a day (QID) | RESPIRATORY_TRACT | 6 refills | Status: AC | PRN
  Filled 2022-07-13: qty 1, 25d supply, fill #0

## 2022-07-18 ENCOUNTER — Ambulatory Visit (INDEPENDENT_AMBULATORY_CARE_PROVIDER_SITE_OTHER): Payer: No Typology Code available for payment source | Admitting: Physical Medicine and Rehabilitation

## 2022-07-18 ENCOUNTER — Other Ambulatory Visit (HOSPITAL_COMMUNITY): Payer: Self-pay

## 2022-07-18 ENCOUNTER — Encounter: Payer: Self-pay | Admitting: Physical Medicine and Rehabilitation

## 2022-07-18 ENCOUNTER — Ambulatory Visit: Payer: Self-pay

## 2022-07-18 VITALS — BP 154/83 | HR 80

## 2022-07-18 DIAGNOSIS — M5116 Intervertebral disc disorders with radiculopathy, lumbar region: Secondary | ICD-10-CM

## 2022-07-18 DIAGNOSIS — M5416 Radiculopathy, lumbar region: Secondary | ICD-10-CM | POA: Diagnosis not present

## 2022-07-18 DIAGNOSIS — G8929 Other chronic pain: Secondary | ICD-10-CM

## 2022-07-18 DIAGNOSIS — S39012A Strain of muscle, fascia and tendon of lower back, initial encounter: Secondary | ICD-10-CM | POA: Diagnosis not present

## 2022-07-18 DIAGNOSIS — M5441 Lumbago with sciatica, right side: Secondary | ICD-10-CM | POA: Diagnosis not present

## 2022-07-18 MED ORDER — MELOXICAM 15 MG PO TABS
15.0000 mg | ORAL_TABLET | Freq: Every day | ORAL | 0 refills | Status: AC
  Filled 2022-07-18: qty 30, 30d supply, fill #0

## 2022-07-18 MED ORDER — METHYLPREDNISOLONE ACETATE 80 MG/ML IJ SUSP
80.0000 mg | Freq: Once | INTRAMUSCULAR | Status: AC
  Administered 2022-07-18: 80 mg

## 2022-07-18 NOTE — Procedures (Signed)
Lumbar Epidural Steroid Injection - Interlaminar Approach with Fluoroscopic Guidance  Patient: Gary Robertson      Date of Birth: [birth year] MRN: [id number] PCP: de [place], Raymond J, MD      Visit Date: 07/18/2022   Universal Protocol:     Consent Given By: the patient  Position: PRONE  Additional Comments: Vital signs were monitored before and after the procedure. Patient was prepped and draped in the usual sterile fashion. The correct patient, procedure, and site was verified.   Injection Procedure Details:   Procedure diagnoses: Lumbar radiculopathy [M54.16]   Meds Administered:  Meds ordered this encounter  Medications   methylPREDNISolone acetate (DEPO-MEDROL) injection 80 mg   meloxicam (MOBIC) 15 MG tablet    Sig: Take 1 tablet (15 mg total) by mouth daily. Take with food    Dispense:  30 tablet    Refill:  0     Laterality: Right  Location/Site:  L5-S1  Needle: 3.5 in., 20 ga. Tuohy  Needle Placement: Paramedian epidural  Findings:   -Comments: Excellent flow of contrast into the epidural space.  Procedure Details: Using a paramedian approach from the side mentioned above, the region overlying the inferior lamina was localized under fluoroscopic visualization and the soft tissues overlying this structure were infiltrated with 4 ml. of 1% Lidocaine without Epinephrine. The Tuohy needle was inserted into the epidural space using a paramedian approach.   The epidural space was localized using loss of resistance along with counter oblique bi-planar fluoroscopic views.  After negative aspirate for air, blood, and CSF, a 2 ml. volume of Isovue-250 was injected into the epidural space and the flow of contrast was observed. Radiographs were obtained for documentation purposes.    The injectate was administered into the level noted above.   Additional Comments:  The patient tolerated the procedure well Dressing: 2 x 2 sterile gauze and Band-Aid    Post-procedure  details: Patient was observed during the procedure. Post-procedure instructions were reviewed.  Patient left the clinic in stable condition.

## 2022-07-18 NOTE — Patient Instructions (Signed)

## 2022-07-18 NOTE — Progress Notes (Signed)
Functional Pain Scale - descriptive words and definitions   Severe (9)  Cannot do any ADL's even with assistance can barely talk/unable to sleep and unable to use distraction. Severe range order  Average Pain 9   +Driver, -BT, -Dye Allergies.  Lower back pain on right with pain and numbness radiating down right leg. Painful to stand

## 2022-07-18 NOTE — Progress Notes (Signed)
Gary Robertson - 47 y.o. male MRN [id number]  Date of birth: 1975-07-25  Office Visit Note: Visit Date: 07/18/2022 PCP: Tennis Must [place], Blondell Reveal, MD Referred by: de [place], Raymond J, MD  Subjective: Chief Complaint  Patient presents with   Lower Back - Pain   HPI: Gary Robertson is a 47 y.o. male who comes in today for evaluation and management acute on chronic exacerbation of right low back pain and right radicular leg pain.  He came in today after lifting some boxes last week as he is moving out of his house and subsequently had pretty intense pain in the right low back buttock hip and leg in a pretty classic L5 distribution.  No left-sided complaints.  No focal weakness no bowel or bladder difficulty.  Significant severe pain not relieved with the use of some ibuprofen and heat and ice and stretching.  He reported that he was tolerating it okay at first and then he began having symptoms even while standing.  He was getting some relief with standing.  He does note if he flexes his right foot he actually get some relief.  On the functional pain scale he lists his symptoms as a 9 he cannot really do much in the way of ADLs without assistance.  He rates his average pain as 9 out of 10 as well.  He has had prior epidural injection with good relief.  Prior MRI from 2017 showing fairly normal spine with some degenerative early changes at L4-5 with right-sided lateral recess narrowing without high-grade central stenosis or severe disc herniation.  He reports his symptoms now are much worse than they were in 2017.  He has had pain off and on since that time with good periods where he did fairly well in some periods where it was exacerbated but nothing like this.      Review of Systems  Musculoskeletal:  Positive for back pain and joint pain.  Neurological:  Positive for tingling.  All other systems reviewed and are negative.  Otherwise per HPI.  Assessment & Plan: Visit Diagnoses:    ICD-10-CM   1.  Lumbar radiculopathy  M54.16 XR C-ARM NO REPORT    Epidural Steroid injection    methylPREDNISolone acetate (DEPO-MEDROL) injection 80 mg    2. Radiculopathy due to lumbar intervertebral disc disorder  M51.16     3. Strain of lumbar region, initial encounter  S39.012A     4. Chronic right-sided low back pain with right-sided sciatica  M54.41    G89.29        Plan: Findings:  Chronic history of intermittent severe low back pain particularly on the right more of an L5 distribution historically.  He has done well in the past with epidural injection.  Initially had findings of MRI issue at L3-4 which was more spondylitic change and disc protrusion centrally but no focal compression.  Symptoms of always seem to be more L5 related he does have some lateral recess narrowing at L4-5 on the right more than left although it is not really called out on the report I did look at the imaging again.  He now has acute flareup and exacerbation and sprain strain type of activity this past week.  He is in a pretty severe amount of pain and I do think epidural injection probably be worthwhile.  We did complete the epidural injection today and I did not charge for this as we just decided to do that today.  I also  provided meloxicam to be taken for the next month daily.  We talked about neural flossing.  If he does not get much relief over the next week to week and a half I would look at updated imaging of the lumbar spine.  He likely could have small annular tear or disc herniation although he is having no red flag complaints other than pain with paresthesia.    Meds & Orders:  Meds ordered this encounter  Medications   methylPREDNISolone acetate (DEPO-MEDROL) injection 80 mg   meloxicam (MOBIC) 15 MG tablet    Sig: Take 1 tablet (15 mg total) by mouth daily. Take with food    Dispense:  30 tablet    Refill:  0    Orders Placed This Encounter  Procedures   XR C-ARM NO REPORT   Epidural Steroid injection     Follow-up: No follow-ups on file.   Procedures: No procedures performed  Lumbar Epidural Steroid Injection - Interlaminar Approach with Fluoroscopic Guidance  Patient: Gary Robertson      Date of Birth: [birth year] MRN: [id number] PCP: de [place], Raymond J, MD      Visit Date: 07/18/2022   Universal Protocol:     Consent Given By: the patient  Position: PRONE  Additional Comments: Vital signs were monitored before and after the procedure. Patient was prepped and draped in the usual sterile fashion. The correct patient, procedure, and site was verified.   Injection Procedure Details:   Procedure diagnoses: Lumbar radiculopathy [M54.16]   Meds Administered:  Meds ordered this encounter  Medications   methylPREDNISolone acetate (DEPO-MEDROL) injection 80 mg   meloxicam (MOBIC) 15 MG tablet    Sig: Take 1 tablet (15 mg total) by mouth daily. Take with food    Dispense:  30 tablet    Refill:  0     Laterality: Right  Location/Site:  L5-S1  Needle: 3.5 in., 20 ga. Tuohy  Needle Placement: Paramedian epidural  Findings:   -Comments: Excellent flow of contrast into the epidural space.  Procedure Details: Using a paramedian approach from the side mentioned above, the region overlying the inferior lamina was localized under fluoroscopic visualization and the soft tissues overlying this structure were infiltrated with 4 ml. of 1% Lidocaine without Epinephrine. The Tuohy needle was inserted into the epidural space using a paramedian approach.   The epidural space was localized using loss of resistance along with counter oblique bi-planar fluoroscopic views.  After negative aspirate for air, blood, and CSF, a 2 ml. volume of Isovue-250 was injected into the epidural space and the flow of contrast was observed. Radiographs were obtained for documentation purposes.    The injectate was administered into the level noted above.   Additional Comments:  The patient tolerated the  procedure well Dressing: 2 x 2 sterile gauze and Band-Aid    Post-procedure details: Patient was observed during the procedure. Post-procedure instructions were reviewed.  Patient left the clinic in stable condition.   Clinical History: MRI LUMBAR SPINE WITHOUT CONTRAST   TECHNIQUE: Multiplanar, multisequence MR imaging of the lumbar spine was performed. No intravenous contrast was administered.   COMPARISON:  MRI lumbar spine 07/01/2009.   FINDINGS: Small Schmorl's node in the inferior endplate of G29 is unchanged. There is no fracture or malalignment. Marrow signal is unremarkable. The conus medullaris is normal in signal and position. Mild congenital central canal narrowing is noted as on the prior study. Imaged intra-abdominal contents appear normal.   T11-12 and T12-L1 are imaged  in the sagittal plane only. The central canal and foramina are open with a minimal disc bulge noted at T12-L1.   L1-2:  Negative.   L2-3: No change in a minimal disc bulge without central canal or foraminal stenosis.   L3-4: Mild facet degenerative change and a shallow disc bulge are seen. Epidural fat is somewhat prominent. There is mild central canal narrowing. No nerve root compression. Mild left foraminal narrowing is seen. The right foramen is open. Spondylosis has slightly progressed since the prior study.   L4-5: Mild disc bulge and facet arthropathy are seen. The central spinal canal and foramina are open. The appearance is unchanged.   L5-S1: Negative.   IMPRESSION: Slight progression of mild spondylosis at L3-4 where there is mild central canal and left foraminal narrowing. No nerve root compression is identified.     Electronically Signed   By: Inge Rise M.D.   On: 11/25/2015 12:03   He reports that he has quit smoking. He has never used smokeless tobacco.  Recent Labs    03/02/22 0807  HGBA1C 5.6    Objective:  VS:  HT:    WT:   BMI:     BP:(!) 154/83   HR:80bpm  TEMP: ( )  RESP:  Physical Exam Vitals and nursing note reviewed.  Constitutional:      General: He is not in acute distress.    Appearance: Normal appearance. He is well-developed. He is not ill-appearing.  HENT:     Head: Normocephalic and atraumatic.     Right Ear: External ear normal.     Left Ear: External ear normal.     Nose: No congestion.  Eyes:     Extraocular Movements: Extraocular movements intact.     Conjunctiva/sclera: Conjunctivae normal.     Pupils: Pupils are equal, round, and reactive to light.  Cardiovascular:     Rate and Rhythm: Normal rate.     Pulses: Normal pulses.     Heart sounds: Normal heart sounds.  Pulmonary:     Effort: Pulmonary effort is normal. No respiratory distress.  Abdominal:     General: There is no distension.     Palpations: Abdomen is soft.  Musculoskeletal:        General: No tenderness or signs of injury.     Cervical back: Normal range of motion and neck supple. No rigidity.     Right lower leg: No edema.     Left lower leg: No edema.     Comments: Patient has good distal strength without clonus.  He does have dysesthesia and sensory change in L5 dermatome on the right.  Normal sensation on the left.  He has a positive slump test on the right and negative on the left.  He walks with somewhat forward flexed lumbar spine.  Skin:    General: Skin is warm and dry.     Findings: No erythema or rash.  Neurological:     General: No focal deficit present.     Mental Status: He is alert and oriented to person, place, and time.     Cranial Nerves: No cranial nerve deficit.     Sensory: Sensory deficit present.     Motor: No weakness or abnormal muscle tone.     Coordination: Coordination normal.     Gait: Gait abnormal.  Psychiatric:        Mood and Affect: Mood normal.        Behavior: Behavior normal.  Ortho Exam  Imaging: XR C-ARM NO REPORT  Result Date: 07/18/2022 Please see Notes tab for imaging impression.    Past Medical/Family/Surgical/Social History: Medications & Allergies reviewed per EMR, new medications updated. Patient Active Problem List   Diagnosis Date Noted   Wellness examination 03/09/2022   Subclinical hypothyroidism 03/09/2022   Hyperlipidemia 01/30/2022   Lateral epicondylitis, right elbow 05/13/2021   Past Medical History:  Diagnosis Date   Anxiety    Asthma    seasonal allergy related-uses PRN inhaler   Mixed hyperlipidemia    OSA (obstructive sleep apnea)    MILD- no CPAP warranted at this time (04/06/2022)   Family History  Problem Relation Age of Onset   Cancer Mother        CLL   Lymphoma Mother    Hypertension Father    Lung cancer Father        asbestos exposure   Cancer Sister    Breast cancer Sister    Cancer Sister    Breast cancer Sister    Heart attack Neg Hx    Stroke Neg Hx    Prostate cancer Neg Hx    Colon cancer Neg Hx    Colon polyps Neg Hx    Rectal cancer Neg Hx    Esophageal cancer Neg Hx    Past Surgical History:  Procedure Laterality Date   CLOSED REDUCTION CLAVICLE FRACTURE Right 2009   VASECTOMY  2023   WISDOM TOOTH EXTRACTION  1994   Social History   Occupational History   Occupation: IT  Tobacco Use   Smoking status: Former   Smokeless tobacco: Never  Scientific laboratory technician Use: Never used  Substance and Sexual Activity   Alcohol use: Yes    Alcohol/week: 0.0 - 6.0 standard drinks of alcohol   Drug use: No   Sexual activity: Not on file

## 2022-07-20 ENCOUNTER — Other Ambulatory Visit (HOSPITAL_COMMUNITY): Payer: Self-pay

## 2022-07-20 ENCOUNTER — Encounter: Payer: Self-pay | Admitting: Physical Medicine and Rehabilitation

## 2022-07-20 ENCOUNTER — Other Ambulatory Visit: Payer: Self-pay | Admitting: Physical Medicine and Rehabilitation

## 2022-07-20 MED ORDER — METHOCARBAMOL 500 MG PO TABS
500.0000 mg | ORAL_TABLET | Freq: Three times a day (TID) | ORAL | 0 refills | Status: AC
  Filled 2022-07-20: qty 90, 30d supply, fill #0

## 2022-07-26 ENCOUNTER — Other Ambulatory Visit: Payer: Self-pay | Admitting: Physical Medicine and Rehabilitation

## 2022-07-26 DIAGNOSIS — M5416 Radiculopathy, lumbar region: Secondary | ICD-10-CM

## 2022-07-30 ENCOUNTER — Ambulatory Visit
Admission: RE | Admit: 2022-07-30 | Discharge: 2022-07-30 | Disposition: A | Discharge status: Home / Self Care | Payer: 59 | Source: Ambulatory Visit | Attending: Physical Medicine and Rehabilitation | Admitting: Physical Medicine and Rehabilitation

## 2022-07-30 DIAGNOSIS — M48061 Spinal stenosis, lumbar region without neurogenic claudication: Secondary | ICD-10-CM | POA: Diagnosis not present

## 2022-07-30 DIAGNOSIS — M79604 Pain in right leg: Secondary | ICD-10-CM | POA: Diagnosis not present

## 2022-07-30 DIAGNOSIS — M5416 Radiculopathy, lumbar region: Secondary | ICD-10-CM

## 2022-07-30 DIAGNOSIS — M545 Low back pain, unspecified: Secondary | ICD-10-CM | POA: Diagnosis not present

## 2022-07-30 DIAGNOSIS — R200 Anesthesia of skin: Secondary | ICD-10-CM | POA: Diagnosis not present

## 2022-07-31 ENCOUNTER — Telehealth: Payer: Self-pay | Admitting: Physical Medicine and Rehabilitation

## 2022-07-31 NOTE — Telephone Encounter (Signed)
I called patient to discuss lumbar MRI results. No answer, I did leave message for him to call me back. I am aware he is relocating tomorrow, however wanted to make sure he has copy of imaging to take with him.

## 2022-07-31 NOTE — Telephone Encounter (Signed)
Pt returned call to [place]. Please call pt at [phone].

## 2022-09-08 ENCOUNTER — Ambulatory Visit (HOSPITAL_BASED_OUTPATIENT_CLINIC_OR_DEPARTMENT_OTHER): Payer: Self-pay

## 2024-02-01 ENCOUNTER — Other Ambulatory Visit: Payer: Self-pay
